# Patient Record
Sex: Male | Born: 1965 | Race: White | Hispanic: No | Marital: Single | Smoking: Current some day smoker
Health system: Southern US, Community
[De-identification: ages and names within clinical notes are randomized; demographics above are authoritative.]

## PROBLEM LIST (undated history)

## (undated) DIAGNOSIS — J942 Hemothorax: Secondary | ICD-10-CM

## (undated) HISTORY — PX: CHEST TUBE INSERTION: SHX231

---
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
## 2019-02-12 ENCOUNTER — Inpatient Hospital Stay (HOSPITAL_COMMUNITY)
Admission: EM | Admit: 2019-02-12 | Discharge: 2019-02-16 | DRG: 200 | Disposition: A | Discharge status: Home / Self Care | Payer: Managed Care, Other (non HMO) | Attending: Surgery | Admitting: Surgery

## 2019-02-12 ENCOUNTER — Emergency Department (HOSPITAL_COMMUNITY): Payer: Managed Care, Other (non HMO)

## 2019-02-12 ENCOUNTER — Encounter (HOSPITAL_COMMUNITY): Payer: Self-pay | Admitting: *Deleted

## 2019-02-12 ENCOUNTER — Other Ambulatory Visit: Payer: Self-pay

## 2019-02-12 DIAGNOSIS — Z9689 Presence of other specified functional implants: Secondary | ICD-10-CM

## 2019-02-12 DIAGNOSIS — J90 Pleural effusion, not elsewhere classified: Secondary | ICD-10-CM | POA: Diagnosis present

## 2019-02-12 DIAGNOSIS — J439 Emphysema, unspecified: Secondary | ICD-10-CM | POA: Diagnosis present

## 2019-02-12 DIAGNOSIS — E860 Dehydration: Secondary | ICD-10-CM | POA: Diagnosis present

## 2019-02-12 DIAGNOSIS — W11XXXA Fall on and from ladder, initial encounter: Secondary | ICD-10-CM | POA: Diagnosis present

## 2019-02-12 DIAGNOSIS — S2242XA Multiple fractures of ribs, left side, initial encounter for closed fracture: Secondary | ICD-10-CM | POA: Diagnosis present

## 2019-02-12 DIAGNOSIS — J939 Pneumothorax, unspecified: Secondary | ICD-10-CM

## 2019-02-12 DIAGNOSIS — E875 Hyperkalemia: Secondary | ICD-10-CM | POA: Diagnosis present

## 2019-02-12 DIAGNOSIS — Z4682 Encounter for fitting and adjustment of non-vascular catheter: Secondary | ICD-10-CM

## 2019-02-12 DIAGNOSIS — F17210 Nicotine dependence, cigarettes, uncomplicated: Secondary | ICD-10-CM | POA: Diagnosis present

## 2019-02-12 DIAGNOSIS — S272XXA Traumatic hemopneumothorax, initial encounter: Secondary | ICD-10-CM | POA: Diagnosis present

## 2019-02-12 DIAGNOSIS — N289 Disorder of kidney and ureter, unspecified: Secondary | ICD-10-CM | POA: Diagnosis present

## 2019-02-12 DIAGNOSIS — S299XXA Unspecified injury of thorax, initial encounter: Secondary | ICD-10-CM | POA: Diagnosis present

## 2019-02-12 DIAGNOSIS — Z1159 Encounter for screening for other viral diseases: Secondary | ICD-10-CM | POA: Diagnosis not present

## 2019-02-12 DIAGNOSIS — J410 Simple chronic bronchitis: Secondary | ICD-10-CM | POA: Diagnosis present

## 2019-02-12 DIAGNOSIS — J942 Hemothorax: Secondary | ICD-10-CM

## 2019-02-12 DIAGNOSIS — Z9889 Other specified postprocedural states: Secondary | ICD-10-CM

## 2019-02-12 DIAGNOSIS — S270XXA Traumatic pneumothorax, initial encounter: Secondary | ICD-10-CM

## 2019-02-12 HISTORY — DX: Hemothorax: J94.2

## 2019-02-12 LAB — BPAM FFP
Blood Product Expiration Date: 202007052359
Blood Product Expiration Date: 202007052359
ISSUE DATE / TIME: 202007031109
ISSUE DATE / TIME: 202007031109
Unit Type and Rh: 6200
Unit Type and Rh: 6200

## 2019-02-12 LAB — COMPREHENSIVE METABOLIC PANEL
ALT: 22 U/L (ref 0–44)
AST: 39 U/L (ref 15–41)
Albumin: 3.8 g/dL (ref 3.5–5.0)
Alkaline Phosphatase: 79 U/L (ref 38–126)
Anion gap: 11 (ref 5–15)
BUN: 20 mg/dL (ref 6–20)
CO2: 21 mmol/L — ABNORMAL LOW (ref 22–32)
Calcium: 8.8 mg/dL — ABNORMAL LOW (ref 8.9–10.3)
Chloride: 103 mmol/L (ref 98–111)
Creatinine, Ser: 1.06 mg/dL (ref 0.61–1.24)
GFR calc Af Amer: >60 mL/min (ref >60)
GFR calc non Af Amer: >60 mL/min (ref >60)
Glucose, Bld: 136 mg/dL — ABNORMAL HIGH (ref 70–99)
Potassium: 5.4 mmol/L — ABNORMAL HIGH (ref 3.5–5.1)
Sodium: 135 mmol/L (ref 135–145)
Total Bilirubin: 1.2 mg/dL (ref 0.3–1.2)
Total Protein: 6.9 g/dL (ref 6.5–8.1)

## 2019-02-12 LAB — CBC
HCT: 48.7 % (ref 39.0–52.0)
HCT: 46.5 % (ref 39.0–52.0)
Hemoglobin: 16.8 g/dL (ref 13.0–17.0)
Hemoglobin: 16.3 g/dL (ref 13.0–17.0)
MCH: 31.3 pg (ref 26.0–34.0)
MCH: 31.2 pg (ref 26.0–34.0)
MCHC: 34.5 g/dL (ref 30.0–36.0)
MCHC: 35.1 g/dL (ref 30.0–36.0)
MCV: 90.7 fL (ref 80.0–100.0)
MCV: 88.9 fL (ref 80.0–100.0)
Platelets: 281 10*3/uL (ref 150–400)
Platelets: 263 10*3/uL (ref 150–400)
RBC: 5.37 MIL/uL (ref 4.22–5.81)
RBC: 5.23 MIL/uL (ref 4.22–5.81)
RDW: 12.7 % (ref 11.5–15.5)
RDW: 12.5 % (ref 11.5–15.5)
WBC: 18.2 10*3/uL — ABNORMAL HIGH (ref 4.0–10.5)
WBC: 18.5 10*3/uL — ABNORMAL HIGH (ref 4.0–10.5)
nRBC: 0 % (ref 0.0–0.2)
nRBC: 0 % (ref 0.0–0.2)

## 2019-02-12 LAB — CREATININE, SERUM
Creatinine, Ser: 1.21 mg/dL (ref 0.61–1.24)
GFR calc Af Amer: >60 mL/min (ref >60)
GFR calc non Af Amer: >60 mL/min (ref >60)

## 2019-02-12 LAB — PREPARE FRESH FROZEN PLASMA
Unit division: 0
Unit division: 0

## 2019-02-12 LAB — ETHANOL: Alcohol, Ethyl (B): <10 mg/dL (ref <10)

## 2019-02-12 LAB — PROTIME-INR
INR: 1 (ref 0.8–1.2)
Prothrombin Time: 12.8 seconds (ref 11.4–15.2)

## 2019-02-12 LAB — SARS CORONAVIRUS 2 BY RT PCR (HOSPITAL ORDER, PERFORMED IN ~~LOC~~ HOSPITAL LAB): SARS Coronavirus 2: NEGATIVE

## 2019-02-12 LAB — ABO/RH: ABO/RH(D): O POS

## 2019-02-12 MED ORDER — HYDROMORPHONE HCL 1 MG/ML IJ SOLN
1.0000 mg | Freq: Once | INTRAMUSCULAR | Status: AC
  Administered 2019-02-12: 1 mg via INTRAVENOUS
  Filled 2019-02-12: qty 1

## 2019-02-12 MED ORDER — HYDROMORPHONE HCL 1 MG/ML IJ SOLN
2.0000 mg | Freq: Once | INTRAMUSCULAR | Status: AC
  Administered 2019-02-12: 2 mg via INTRAVENOUS
  Filled 2019-02-12: qty 2

## 2019-02-12 MED ORDER — ONDANSETRON 4 MG PO TBDP: 4.0000 mg | ORAL_TABLET | Freq: Four times a day (QID) | ORAL | Status: DC | PRN

## 2019-02-12 MED ORDER — ACETAMINOPHEN 325 MG PO TABS: 650.0000 mg | ORAL_TABLET | ORAL | Status: DC | PRN

## 2019-02-12 MED ORDER — METOPROLOL TARTRATE 5 MG/5ML IV SOLN: 5.0000 mg | Freq: Four times a day (QID) | INTRAVENOUS | Status: DC | PRN

## 2019-02-12 MED ORDER — ENOXAPARIN SODIUM 40 MG/0.4ML ~~LOC~~ SOLN
40.0000 mg | SUBCUTANEOUS | Status: DC
  Administered 2019-02-13 – 2019-02-16 (×4): 40 mg via SUBCUTANEOUS
  Filled 2019-02-12 (×4): qty 0.4

## 2019-02-12 MED ORDER — HYDROMORPHONE HCL 1 MG/ML IJ SOLN
0.5000 mg | INTRAMUSCULAR | Status: DC | PRN
  Administered 2019-02-12 – 2019-02-13 (×2): 1 mg via INTRAVENOUS
  Filled 2019-02-12: qty 1
  Filled 2019-02-12: qty 2

## 2019-02-12 MED ORDER — OXYCODONE HCL 5 MG PO TABS
5.0000 mg | ORAL_TABLET | ORAL | Status: DC | PRN
  Administered 2019-02-12: 5 mg via ORAL
  Administered 2019-02-13 (×3): 10 mg via ORAL
  Filled 2019-02-12: qty 2
  Filled 2019-02-12: qty 1
  Filled 2019-02-12 (×2): qty 2

## 2019-02-12 MED ORDER — BACITRACIN ZINC 500 UNIT/GM EX OINT
TOPICAL_OINTMENT | Freq: Two times a day (BID) | CUTANEOUS | Status: DC
  Administered 2019-02-12 – 2019-02-14 (×4): 31.5556 via TOPICAL
  Administered 2019-02-14 – 2019-02-15 (×2): 1 via TOPICAL
  Administered 2019-02-15 – 2019-02-16 (×2): 31.5556 via TOPICAL
  Filled 2019-02-12: qty 28.4

## 2019-02-12 MED ORDER — DOCUSATE SODIUM 100 MG PO CAPS
100.0000 mg | ORAL_CAPSULE | Freq: Two times a day (BID) | ORAL | Status: DC
  Administered 2019-02-12 – 2019-02-16 (×8): 100 mg via ORAL
  Filled 2019-02-12 (×8): qty 1

## 2019-02-12 MED ORDER — FENTANYL CITRATE (PF) 100 MCG/2ML IJ SOLN
INTRAMUSCULAR | Status: AC
  Filled 2019-02-12: qty 2

## 2019-02-12 MED ORDER — IOPAMIDOL (ISOVUE-300) INJECTION 61%
100.0000 mL | Freq: Once | INTRAVENOUS | Status: AC | PRN
  Administered 2019-02-12: 14:00:00 100 mL via INTRAVENOUS

## 2019-02-12 MED ORDER — DEXTROSE-NACL 5-0.9 % IV SOLN
INTRAVENOUS | Status: DC
  Administered 2019-02-12 – 2019-02-16 (×5): via INTRAVENOUS

## 2019-02-12 MED ORDER — ONDANSETRON HCL 4 MG/2ML IJ SOLN: 4.0000 mg | Freq: Four times a day (QID) | INTRAMUSCULAR | Status: DC | PRN

## 2019-02-12 NOTE — ED Notes (Signed)
Attempted to call wife [phone]  Mail box full unable to leave message

## 2019-02-12 NOTE — ED Notes (Signed)
ED TO INPATIENT HANDOFF REPORT  ED Nurse Name and Phone #:   S Name/Age/Gender Jake Roach 53 y.o. male Room/Bed: TRAAC/TRAAC  Code Status   Code Status: Full Code  Home/SNF/Other Home Patient oriented to: self Is this baseline? Yes   Triage Complete: Triage complete  Chief Complaint Level I  Triage Note Patient presents to ed via [facility] states he fell 10 feet yest off a ladder landing on his left side. States he thought he would be ok, went home and this am states he was having increased sob and swelling to left anterior chest. Patient is alert oriented.    Allergies No Known Allergies  Level of Care/Admitting Diagnosis ED Disposition    ED Disposition Condition Comment   Admit  [facility] [[id number]]  Level of Care: Progressive [102]  Covid Evaluation: Asymptomatic Screening Protocol (No Symptoms)  Diagnosis: Hemopneumothorax on left [[id number]][[id number]]  Admitting Physician: TRAUMA MD [[id number]]  Attending Physician: TRAUMA MD [[id number]]  Estimated length of stay: past midnight tomorrow  Certification:: I certify this patient will need inpatient services for at least 2 midnights  PT Class (Do Not Modify): Inpatient [101]  PT Acc Code (Do Not Modify): Private [1]       B Medical/Surgery History History reviewed. No pertinent past medical history. History reviewed. No pertinent surgical history.   A IV Location/Drains/Wounds Patient Lines/Drains/Airways Status   Active Line/Drains/Airways    Name:   Placement date:   Placement time:   Site:   Days:   Peripheral IV 02/12/19 Right Antecubital   02/12/19    1100    Antecubital   less than 1   Peripheral IV 02/12/19 Anterior;Distal;Left;Upper;Right Antecubital   02/12/19    1158    Antecubital   less than 1          Intake/Output Last 24 hours No intake or output data in the 24 hours ending 02/12/19 1825  Labs/Imaging Results for orders placed or performed during the hospital  encounter of 02/12/19 (from the past 48 hour(s))  Prepare fresh frozen plasma     Status: None   Collection Time: 02/12/19 11:05 AM  Result Value Ref Range   Unit Number [id number]    Blood Component Type THAWED PLASMA    Unit division 00    Status of Unit REL FROM [facility]    Unit tag comment EMERGENCY RELEASE    Transfusion Status OK TO TRANSFUSE    Unit Number [id number]    Blood Component Type THAWED PLASMA    Unit division 00    [facility]    Unit tag comment EMERGENCY RELEASE    Transfusion Status      OK TO TRANSFUSE Performed at [facility], [address] [place], [place] [address]   Type and screen Ordered by PROVIDER DEFAULT     Status: None (Preliminary result)   Collection Time: 02/12/19 11:14 AM  Result Value Ref Range   ABO/RH(D) O POS    Antibody Screen NEG    Sample Expiration 02/15/2019,2359    Unit Number [id number]    Blood Component Type RED CELLS,LR    Unit division 00    Status of Unit ALLOCATED    Transfusion Status OK TO TRANSFUSE    Crossmatch Result COMPATIBLE    Unit Number [id number]    Blood Component Type RED CELLS,LR    Unit division 00    Status of Unit ALLOCATED  Transfusion Status OK TO TRANSFUSE    Crossmatch Result COMPATIBLE   ABO/Rh     Status: None   Collection Time: 02/12/19 11:14 AM  Result Value Ref Range   ABO/RH(D)      O POS Performed at [facility], [address], [place], [place] [address]   SARS Coronavirus 2 (CEPHEID - Performed in [facility] hospital lab), Hosp Order     Status: None   Collection Time: 02/12/19 11:20 AM   Specimen: Nasopharyngeal Swab  Result Value Ref Range   SARS Coronavirus 2 NEGATIVE NEGATIVE    Comment: (NOTE) If result is NEGATIVE SARS-CoV-2 target nucleic acids are NOT DETECTED. The SARS-CoV-2 RNA is generally detectable in upper and lower  respiratory specimens during the acute phase of infection. The lowest  concentration of SARS-CoV-2 viral  copies this assay can detect is 250  copies / mL. A negative result does not preclude SARS-CoV-2 infection  and should not be used as the sole basis for treatment or other  patient management decisions.  A negative result may occur with  improper specimen collection / handling, submission of specimen other  than nasopharyngeal swab, presence of viral mutation(s) within the  areas targeted by this assay, and inadequate number of viral copies  (<250 copies / mL). A negative result must be combined with clinical  observations, patient history, and epidemiological information. If result is POSITIVE SARS-CoV-2 target nucleic acids are DETECTED. The SARS-CoV-2 RNA is generally detectable in upper and lower  respiratory specimens dur ing the acute phase of infection.  Positive  results are indicative of active infection with SARS-CoV-2.  Clinical  correlation with patient history and other diagnostic information is  necessary to determine patient infection status.  Positive results do  not rule out bacterial infection or co-infection with other viruses. If result is PRESUMPTIVE POSTIVE SARS-CoV-2 nucleic acids MAY BE PRESENT.   A presumptive positive result was obtained on the submitted specimen  and confirmed on repeat testing.  While 2019 novel coronavirus  (SARS-CoV-2) nucleic acids may be present in the submitted sample  additional confirmatory testing may be necessary for epidemiological  and / or clinical management purposes  to differentiate between  SARS-CoV-2 and other Sarbecovirus currently known to infect humans.  If clinically indicated additional testing with an alternate test  methodology [phone]) is advised. The SARS-CoV-2 RNA is generally  detectable in upper and lower respiratory sp ecimens during the acute  phase of infection. The expected result is Negative. Fact Sheet for Patients:  BoilerBrush.com.cy Fact Sheet for Healthcare  Providers: https://pope.com/ This test is not yet approved or cleared by the [place] FDA and has been authorized for detection and/or diagnosis of SARS-CoV-2 by FDA under an Emergency Use Authorization (EUA).  This EUA will remain in effect (meaning this test can be used) for the duration of the COVID-19 declaration under Section 564(b)(1) of the Act, 21 U.S.C. section 360bbb-3(b)(1), unless the authorization is terminated or revoked sooner. Performed at [facility], [address] [place], [place] [address]   Comprehensive metabolic panel     Status: Abnormal   Collection Time: 02/12/19 12:26 PM  Result Value Ref Range   Sodium 135 135 - 145 mmol/L   Potassium 5.4 (H) 3.5 - 5.1 mmol/L   Chloride 103 98 - 111 mmol/L   CO2 21 (L) 22 - 32 mmol/L   Glucose, Bld 136 (H) 70 - 99 mg/dL   BUN 20 6 - 20 mg/dL  Creatinine, Ser 1.06 0.61 - 1.24 mg/dL   Calcium 8.8 (L) 8.9 - 10.3 mg/dL   Total Protein 6.9 6.5 - 8.1 g/dL   Albumin 3.8 3.5 - 5.0 g/dL   AST 39 15 - 41 U/L   ALT 22 0 - 44 U/L   Alkaline Phosphatase 79 38 - 126 U/L   Total Bilirubin 1.2 0.3 - 1.2 mg/dL   GFR calc non Af Amer >60 >60 mL/min   GFR calc Af Amer >60 >60 mL/min   Anion gap 11 5 - 15    Comment: Performed at [facility], [address] [place], [place] [address]  CBC     Status: Abnormal   Collection Time: 02/12/19 12:26 PM  Result Value Ref Range   WBC 18.2 (H) 4.0 - 10.5 K/uL   RBC 5.37 4.22 - 5.81 MIL/uL   Hemoglobin 16.8 13.0 - 17.0 g/dL   HCT 60.448.7 54.039.0 - 98.152.0 %   MCV 90.7 80.0 - 100.0 fL   MCH 31.3 26.0 - 34.0 pg   MCHC 34.5 30.0 - 36.0 g/dL   RDW 19.112.7 47.811.5 - 29.515.5 %   Platelets 281 150 - 400 K/uL   nRBC 0.0 0.0 - 0.2 %    Comment: Performed at [facility], [address] [place], [place] [address]  Ethanol     Status: None   Collection Time: 02/12/19 12:26 PM  Result Value Ref Range   Alcohol, Ethyl (B) <10 <10 mg/dL    Comment: (NOTE) Lowest  detectable limit for serum alcohol is 10 mg/dL. For medical purposes only. Performed at [facility], [address] [place], [place] [address]   Protime-INR     Status: None   Collection Time: 02/12/19 12:26 PM  Result Value Ref Range   Prothrombin Time 12.8 11.4 - 15.2 seconds   INR 1.0 0.8 - 1.2    Comment: (NOTE) INR goal varies based on device and disease states. Performed at [facility], [address] [place], [place] [address]    Ct Head Wo Contrast  Result Date: 02/12/2019 CLINICAL DATA:  Patient status post fall from a ladder. EXAM: CT HEAD WITHOUT CONTRAST CT CERVICAL SPINE WITHOUT CONTRAST TECHNIQUE: Multidetector CT imaging of the head and cervical spine was performed following the standard protocol without intravenous contrast. Multiplanar CT image reconstructions of the cervical spine were also generated. COMPARISON:  None. FINDINGS: CT HEAD FINDINGS Brain: Ventricles and sulci are appropriate for patient's age. No evidence for acute cortically based infarct, intracranial hemorrhage, mass lesion or mass-effect. Vascular: Unremarkable Skull: Intact Sinuses/Orbits: Right maxillary sinus mucosal thickening. Mastoid air cells are unremarkable. Orbits are unremarkable. Other: Extensive gas within the soft tissues about the skull base. CT CERVICAL SPINE FINDINGS Alignment: Normal anatomic alignment. Skull base and vertebrae: No acute fracture. No primary bone lesion or focal pathologic process. Soft tissues and spinal canal: No prevertebral fluid or swelling. No visible canal hematoma. Disc levels: Degenerative disc disease most pronounced C5-6 and C6-7. Left-sided facet degenerative changes C2-3. No acute fracture. Upper chest: There is an incompletely visualized left apical pneumothorax. There is a layering right hemothorax. Pneumomediastinum. Extensive gas throughout the cervical soft tissues. Other: None IMPRESSION: 1. There is a moderate left apical pneumothorax. Left  chest tube is visualizedon scout. 2. Layering right hemothorax, incompletely visualized. 3. Pneumomediastinum. 4. Extensive gas throughout the visualized cervical soft tissues. 5. No acute intracranial process. 6. No acute cervical spine fracture. 7. Degenerative disc  disease. Electronically Signed   By: Annia Belt M.D.   On: 02/12/2019 13:09   Ct Chest W Contrast  Result Date: 02/12/2019 CLINICAL DATA:  Blunt abdominal trauma. EXAM: CT CHEST, ABDOMEN, AND PELVIS WITH CONTRAST TECHNIQUE: Multidetector CT imaging of the chest, abdomen and pelvis was performed following the standard protocol during bolus administration of intravenous contrast. CONTRAST:  ISOVUE-300 IOPAMIDOL (ISOVUE-300) INJECTION 61% COMPARISON:  None. FINDINGS: CT CHEST FINDINGS Cardiovascular: No significant vascular findings. Normal heart size. No pericardial effusion. Mediastinum/Nodes: Extensive pneumomediastinum is noted which extends into the cervical soft tissues, bilateral supraclavicular area and along the left chest wall. Thyroid gland is unremarkable. No definite adenopathy is noted. Lungs/Pleura: Large right pleural effusion is noted. Emphysematous disease is noted. Pigtail catheter is noted anteriorly in the left upper lobe with minimal left apical pneumothorax. Mild left posterior basilar atelectasis is noted. Musculoskeletal: As noted above, extensive subcutaneous emphysema is seen involving the left lateral chest wall mildly displaced fractures are seen involving the left sixth, seventh, eighth, ninth and tenth ribs posteriorly. CT ABDOMEN PELVIS FINDINGS Hepatobiliary: No focal liver abnormality is seen. No gallstones, gallbladder wall thickening, or biliary dilatation. Pancreas: Unremarkable. No pancreatic ductal dilatation or surrounding inflammatory changes. Spleen: Normal in size without focal abnormality. Adrenals/Urinary Tract: Adrenal glands are unremarkable. Kidneys are normal, without renal calculi, focal lesion,  or hydronephrosis. Bladder is unremarkable. Stomach/Bowel: Stomach is within normal limits. Appendix appears normal. No evidence of bowel wall thickening, distention, or inflammatory changes. Vascular/Lymphatic: No significant vascular findings are present. No enlarged abdominal or pelvic lymph nodes. Reproductive: Prostate is unremarkable. Other: Extensive subcutaneous emphysema is seen involving both lateral abdominal walls extending into the lower anterior abdominal and pelvic wall and scrotum. Musculoskeletal: No acute or significant osseous findings. IMPRESSION: Extensive pneumomediastinum is noted concerning for possible esophageal, tracheal or bronchial injury. This is seen to extend into the cervical soft tissues, as well as the bilateral supraclavicular areas and along the left lateral chest wall. It is also noted to extend into both lateral abdominal walls and into the lower abdominal and pelvic wall and scrotum. Pigtail left-sided chest tube is noted with minimal left apical pneumothorax. Large right pleural effusion is noted. Mild left posterior basilar subsegmental atelectasis is noted. Mildly displaced left sixth, seventh, eighth, ninth and tenth rib fractures are noted. Emphysema (ICD10-J43.9). Electronically Signed   By: Lupita Raider M.D.   On: 02/12/2019 14:46   Ct Cervical Spine Wo Contrast  Result Date: 02/12/2019 CLINICAL DATA:  Patient status post fall from a ladder. EXAM: CT HEAD WITHOUT CONTRAST CT CERVICAL SPINE WITHOUT CONTRAST TECHNIQUE: Multidetector CT imaging of the head and cervical spine was performed following the standard protocol without intravenous contrast. Multiplanar CT image reconstructions of the cervical spine were also generated. COMPARISON:  None. FINDINGS: CT HEAD FINDINGS Brain: Ventricles and sulci are appropriate for patient's age. No evidence for acute cortically based infarct, intracranial hemorrhage, mass lesion or mass-effect. Vascular: Unremarkable Skull:  Intact Sinuses/Orbits: Right maxillary sinus mucosal thickening. Mastoid air cells are unremarkable. Orbits are unremarkable. Other: Extensive gas within the soft tissues about the skull base. CT CERVICAL SPINE FINDINGS Alignment: Normal anatomic alignment. Skull base and vertebrae: No acute fracture. No primary bone lesion or focal pathologic process. Soft tissues and spinal canal: No prevertebral fluid or swelling. No visible canal hematoma. Disc levels: Degenerative disc disease most pronounced C5-6 and C6-7. Left-sided facet degenerative changes C2-3. No acute fracture. Upper chest: There is an incompletely visualized left apical  pneumothorax. There is a layering right hemothorax. Pneumomediastinum. Extensive gas throughout the cervical soft tissues. Other: None IMPRESSION: 1. There is a moderate left apical pneumothorax. Left chest tube is visualizedon scout. 2. Layering right hemothorax, incompletely visualized. 3. Pneumomediastinum. 4. Extensive gas throughout the visualized cervical soft tissues. 5. No acute intracranial process. 6. No acute cervical spine fracture. 7. Degenerative disc disease. Electronically Signed   By: Lovey Newcomer M.D.   On: 02/12/2019 13:09   Ct Abdomen Pelvis W Contrast  Result Date: 02/12/2019 CLINICAL DATA:  Blunt abdominal trauma. EXAM: CT CHEST, ABDOMEN, AND PELVIS WITH CONTRAST TECHNIQUE: Multidetector CT imaging of the chest, abdomen and pelvis was performed following the standard protocol during bolus administration of intravenous contrast. CONTRAST:  163mL ISOVUE-300 IOPAMIDOL (ISOVUE-300) INJECTION 61% COMPARISON:  None. FINDINGS: CT CHEST FINDINGS Cardiovascular: No significant vascular findings. Normal heart size. No pericardial effusion. Mediastinum/Nodes: Extensive pneumomediastinum is noted which extends into the cervical soft tissues, bilateral supraclavicular area and along the left chest wall. Thyroid gland is unremarkable. No definite adenopathy is noted.  Lungs/Pleura: Large right pleural effusion is noted. Emphysematous disease is noted. Pigtail catheter is noted anteriorly in the left upper lobe with minimal left apical pneumothorax. Mild left posterior basilar atelectasis is noted. Musculoskeletal: As noted above, extensive subcutaneous emphysema is seen involving the left lateral chest wall mildly displaced fractures are seen involving the left sixth, seventh, eighth, ninth and tenth ribs posteriorly. CT ABDOMEN PELVIS FINDINGS Hepatobiliary: No focal liver abnormality is seen. No gallstones, gallbladder wall thickening, or biliary dilatation. Pancreas: Unremarkable. No pancreatic ductal dilatation or surrounding inflammatory changes. Spleen: Normal in size without focal abnormality. Adrenals/Urinary Tract: Adrenal glands are unremarkable. Kidneys are normal, without renal calculi, focal lesion, or hydronephrosis. Bladder is unremarkable. Stomach/Bowel: Stomach is within normal limits. Appendix appears normal. No evidence of bowel wall thickening, distention, or inflammatory changes. Vascular/Lymphatic: No significant vascular findings are present. No enlarged abdominal or pelvic lymph nodes. Reproductive: Prostate is unremarkable. Other: Extensive subcutaneous emphysema is seen involving both lateral abdominal walls extending into the lower anterior abdominal and pelvic wall and scrotum. Musculoskeletal: No acute or significant osseous findings. IMPRESSION: Extensive pneumomediastinum is noted concerning for possible esophageal, tracheal or bronchial injury. This is seen to extend into the cervical soft tissues, as well as the bilateral supraclavicular areas and along the left lateral chest wall. It is also noted to extend into both lateral abdominal walls and into the lower abdominal and pelvic wall and scrotum. Pigtail left-sided chest tube is noted with minimal left apical pneumothorax. Large right pleural effusion is noted. Mild left posterior basilar  subsegmental atelectasis is noted. Mildly displaced left sixth, seventh, eighth, ninth and tenth rib fractures are noted. Emphysema (ICD10-J43.9). Electronically Signed   By: Marijo Conception M.D.   On: 02/12/2019 14:46   Dg Chest Portable 1 View  Result Date: 02/12/2019 CLINICAL DATA:  Status post left chest tube insertion. EXAM: PORTABLE CHEST 1 VIEW COMPARISON:  February 12, 2019 11:02 a.m. FINDINGS: The heart size and mediastinal contours are stable. There is interval insertion of left chest tube with significant interval decrease of previously noted left pneumothorax. Evaluation of the left lung is limited due to extensive subcutaneous emphysema over left chest and bilateral neck. Right pleural effusion is unchanged. The visualized skeletal structures are stable. IMPRESSION: Interval insertion of left chest tube with significant interval decrease of previously noted left pneumothorax. Evaluation of the left lung is limited due to extensive subcutaneous emphysema of left chest and  bilateral neck. Electronically Signed   By: Sherian Rein M.D.   On: 02/12/2019 12:11   Dg Chest Portable 1 View  Result Date: 02/12/2019 CLINICAL DATA:  Patient status post fall from ladder. EXAM: PORTABLE CHEST 1 VIEW COMPARISON:  None. FINDINGS: Monitoring leads overlie the patient. Normal cardiac contours. Moderate left pneumothorax. Moderate right pleural effusion. Trace left pleural effusion. Consolidation throughout the lungs bilaterally. Pneumomediastinum. Extensive subcutaneous emphysema overlying the neck and left chest wall soft tissues. Thoracic spine degenerative changes. Displaced left posterior and lateral lower rib fractures. IMPRESSION: 1. Moderate to large left pneumothorax. Subsequent chest radiograph demonstrates a left chest tube in place. 2. Findings suggestive of pneumomediastinum. 3. Moderate right pleural effusion.  Trace left pleural effusion. 4. Consolidative opacities throughout the lungs bilaterally which  may represent contusion or edema. 5. Extensive subcutaneous emphysema overlying the lower neck and left chest wall. 6. Multiple left-sided rib fractures. Electronically Signed   By: Annia Belt M.D.   On: 02/12/2019 12:03   Dg Esophagus W Single Cm (sol Or Thin Ba)  Result Date: 02/12/2019 CLINICAL DATA:  Chest trauma EXAM: ESOPHOGRAM/BARIUM SWALLOW TECHNIQUE: Single contrast examination was performed using water-soluble contrast; Omnipaque 300. FLUOROSCOPY TIME:  Fluoroscopy Time:  30 seconds Radiation Exposure Index (if provided by the fluoroscopic device): Not applicable. Number of Acquired Spot Images: 0 COMPARISON:  CT of earlier today FINDINGS: Focused, single-contrast exam performed with patient in mild LPO and RPO positioning. Preprocedure scout film demonstrates a left-sided chest tube in place with pneumomediastinum. Full column evaluation of the esophagus demonstrates no contrast extravasation to suggest perforation. No obstruction to contrast passage. No pleural contrast at the end of the exam. IMPRESSION: No evidence of esophageal perforation. Electronically Signed   By: Jeronimo Greaves M.D.   On: 02/12/2019 17:08    Pending Labs Unresulted Labs (From admission, onward)    Start     Ordered   02/19/19 0500  Creatinine, serum  (enoxaparin (LOVENOX)    CrCl >/= 30 ml/min)  Weekly,   R    Comments: while on enoxaparin therapy    02/12/19 1747   02/13/19 0500  CBC  Tomorrow morning,   R     02/12/19 1747   02/13/19 0500  Basic metabolic panel  Tomorrow morning,   R     02/12/19 1747   02/12/19 1743  HIV antibody (Routine Testing)  Once,   STAT     02/12/19 1747   02/12/19 1743  CBC  (enoxaparin (LOVENOX)    CrCl >/= 30 ml/min)  Once,   STAT    Comments: Baseline for enoxaparin therapy IF NOT ALREADY DRAWN.  Notify MD if PLT < 100 K.    02/12/19 1747   02/12/19 1743  Creatinine, serum  (enoxaparin (LOVENOX)    CrCl >/= 30 ml/min)  Once,   STAT    Comments: Baseline for enoxaparin therapy  IF NOT ALREADY DRAWN.    02/12/19 1747   02/12/19 1216  CDS serology  (Trauma Level 1)  Once,   STAT     02/12/19 1215          Vitals/Pain Today's Vitals   02/12/19 1645 02/12/19 1700 02/12/19 1710 02/12/19 1821  BP: (!) 142/129 (!) 131/92    Pulse: 94 91    Resp: 15 14    SpO2: 99% 98%    Weight:      Height:      PainSc:   5  7     Isolation  Precautions No active isolations  Medications Medications  fentaNYL (SUBLIMAZE) 100 MCG/2ML injection (has no administration in time range)  enoxaparin (LOVENOX) injection 40 mg (has no administration in time range)  oxyCODONE (Oxy IR/ROXICODONE) immediate release tablet 5-10 mg (5 mg Oral Given 02/12/19 1822)  acetaminophen (TYLENOL) tablet 650 mg (has no administration in time range)  HYDROmorphone (DILAUDID) injection 0.5-2 mg (has no administration in time range)  docusate sodium (COLACE) capsule 100 mg (has no administration in time range)  ondansetron (ZOFRAN-ODT) disintegrating tablet 4 mg (has no administration in time range)    Or  ondansetron (ZOFRAN) injection 4 mg (has no administration in time range)  metoprolol tartrate (LOPRESSOR) injection 5 mg (has no administration in time range)  dextrose 5 %-0.9 % sodium chloride infusion (has no administration in time range)  bacitracin ointment (has no administration in time range)  iopamidol (ISOVUE-300) 61 % injection 100 mL (100 mLs Intravenous Contrast Given 02/12/19 1422)  HYDROmorphone (DILAUDID) injection 1 mg (1 mg Intravenous Given 02/12/19 1426)  HYDROmorphone (DILAUDID) injection 2 mg (2 mg Intravenous Given 02/12/19 1616)    Mobility walks Low fall risk   Focused Assessments Cardiac Assessment Handoff:  Cardiac Rhythm: Normal sinus rhythm No results found for: CKTOTAL, CKMB, CKMBINDEX, TROPONINI No results found for: DDIMER Does the Patient currently have chest pain? No      R Recommendations: See Admitting Provider Note  Report given to:   Additional  Notes:

## 2019-02-12 NOTE — ED Notes (Signed)
Communication with Dr. Barry Dienes. New order for pain medication taken verbal over phone for 2 mg dilaudid once. Pt will now go to imaging. Per Dr. Barry Dienes , Orders will be entered for admission once results are available.

## 2019-02-12 NOTE — ED Notes (Signed)
Called patients daughter Magda Paganini update given. Aware of room number patient will be going to.

## 2019-02-12 NOTE — ED Triage Notes (Signed)
Patient presents to ed via [facility] states he fell 10 feet yest off a ladder landing on his left side. States he thought he would be ok, went home and this am states he was having increased sob and swelling to left anterior chest. Patient is alert oriented.

## 2019-02-12 NOTE — ED Notes (Signed)
Transported to CT 

## 2019-02-12 NOTE — H&P (Addendum)
History   Jake Roach is an 53 y.o. male.   Chief Complaint:  Chief Complaint  Patient presents with   Trauma    Pt came to ED as level 1 trauma.  Pt fell 10 feet from ladder yesterday.  He complains of shortness of breath.  He hit the ground.  He also got an abrasion on his back.  He denies loss of consciousness.  He denies hitting his abdomen.  He denies n/v.  He has not had any more recent falls.  He is a Administrator.  He denied any alcohol or drug use associated with the fall.     History reviewed. No pertinent past medical history.  History reviewed. No pertinent surgical history.  No family history on file. Social History:  reports that he has been smoking. He has never used smokeless tobacco. He reports current alcohol use. He reports that he does not use drugs.  Allergies  No Known Allergies  Home Medications   No outpatient medications have been marked as taking for the 02/12/19 encounter [facility] Encounter).     Trauma Course   Results for orders placed or performed during the hospital encounter of 02/12/19 (from the past 48 hour(s))  Prepare fresh frozen plasma     Status: None   Collection Time: 02/12/19 11:05 AM  Result Value Ref Range   Unit Number [id number]    Blood Component Type THAWED PLASMA    Unit division 00    Status of Unit REL FROM [facility]    Unit tag comment EMERGENCY RELEASE    Transfusion Status OK TO TRANSFUSE    Unit Number [id number]    Blood Component Type THAWED PLASMA    Unit division 00    Status of Unit REL FROM [facility]    Unit tag comment EMERGENCY RELEASE    Transfusion Status      OK TO TRANSFUSE Performed at [facility] [address], [place], [place] [address]   Type and screen Ordered by PROVIDER DEFAULT     Status: None (Preliminary result)   Collection Time: 02/12/19 11:14 AM  Result Value Ref Range   ABO/RH(D) O POS    Antibody Screen NEG    Sample Expiration 02/15/2019,2359    Unit Number  [id number]    Blood Component Type RED CELLS,LR    Unit division 00    Status of Unit ALLOCATED    Transfusion Status OK TO TRANSFUSE    Crossmatch Result COMPATIBLE    Unit Number [id number]    Blood Component Type RED CELLS,LR    Unit division 00    Status of Unit ALLOCATED    Transfusion Status OK TO TRANSFUSE    Crossmatch Result COMPATIBLE   ABO/Rh     Status: None   Collection Time: 02/12/19 11:14 AM  Result Value Ref Range   ABO/RH(D)      O POS Performed at [facility], [place] [address], [place], [place] [address]   SARS Coronavirus 2 (CEPHEID - Performed in [place] hospital lab), Hosp Order     Status: None   Collection Time: 02/12/19 11:20 AM   Specimen: Nasopharyngeal Swab  Result Value Ref Range   SARS Coronavirus 2 NEGATIVE NEGATIVE    Comment: (NOTE) If result is NEGATIVE SARS-CoV-2 target nucleic acids are NOT DETECTED. The SARS-CoV-2 RNA is generally detectable in upper and lower  respiratory specimens during the acute phase of infection. The lowest  concentration of SARS-CoV-2 viral copies  this assay can detect is 250  copies / mL. A negative result does not preclude SARS-CoV-2 infection  and should not be used as the sole basis for treatment or other  patient management decisions.  A negative result may occur with  improper specimen collection / handling, submission of specimen other  than nasopharyngeal swab, presence of viral mutation(s) within the  areas targeted by this assay, and inadequate number of viral copies  (<250 copies / mL). A negative result must be combined with clinical  observations, patient history, and epidemiological information. If result is POSITIVE SARS-CoV-2 target nucleic acids are DETECTED. The SARS-CoV-2 RNA is generally detectable in upper and lower  respiratory specimens dur ing the acute phase of infection.  Positive  results are indicative of active infection with SARS-CoV-2.  Clinical  correlation with  patient history and other diagnostic information is  necessary to determine patient infection status.  Positive results do  not rule out bacterial infection or co-infection with other viruses. If result is PRESUMPTIVE POSTIVE SARS-CoV-2 nucleic acids MAY BE PRESENT.   A presumptive positive result was obtained on the submitted specimen  and confirmed on repeat testing.  While 2019 novel coronavirus  (SARS-CoV-2) nucleic acids may be present in the submitted sample  additional confirmatory testing may be necessary for epidemiological  and / or clinical management purposes  to differentiate between  SARS-CoV-2 and other Sarbecovirus currently known to infect humans.  If clinically indicated additional testing with an alternate test  methodology [phone]) is advised. The SARS-CoV-2 RNA is generally  detectable in upper and lower respiratory sp ecimens during the acute  phase of infection. The expected result is Negative. Fact Sheet for Patients:  BoilerBrush.com.cy Fact Sheet for Healthcare Providers: https://pope.com/ This test is not yet approved or cleared by the [place] FDA and has been authorized for detection and/or diagnosis of SARS-CoV-2 by FDA under an Emergency Use Authorization (EUA).  This EUA will remain in effect (meaning this test can be used) for the duration of the COVID-19 declaration under Section 564(b)(1) of the Act, 21 U.S.C. section 360bbb-3(b)(1), unless the authorization is terminated or revoked sooner. Performed at [facility], [address], [place], [place] [address]   Comprehensive metabolic panel     Status: Abnormal   Collection Time: 02/12/19 12:26 PM  Result Value Ref Range   Sodium 135 135 - 145 mmol/L   Potassium 5.4 (H) 3.5 - 5.1 mmol/L   Chloride 103 98 - 111 mmol/L   CO2 21 (L) 22 - 32 mmol/L   Glucose, Bld 136 (H) 70 - 99 mg/dL   BUN 20 6 - 20 mg/dL   Creatinine, Ser 8.11 0.61 -  1.24 mg/dL   Calcium 8.8 (L) 8.9 - 10.3 mg/dL   Total Protein 6.9 6.5 - 8.1 g/dL   Albumin 3.8 3.5 - 5.0 g/dL   AST 39 15 - 41 U/L   ALT 22 0 - 44 U/L   Alkaline Phosphatase 79 38 - 126 U/L   Total Bilirubin 1.2 0.3 - 1.2 mg/dL   GFR calc non Af Amer >60 >60 mL/min   GFR calc Af Amer >60 >60 mL/min   Anion gap 11 5 - 15    Comment: Performed at [facility], [address], [place], [place] [address]  CBC     Status: Abnormal   Collection Time: 02/12/19 12:26 PM  Result Value Ref Range   WBC 18.2 (H) 4.0 - 10.5 K/uL  RBC 5.37 4.22 - 5.81 MIL/uL   Hemoglobin 16.8 13.0 - 17.0 g/dL   HCT 40.9 81.1 - 91.4 %   MCV 90.7 80.0 - 100.0 fL   MCH 31.3 26.0 - 34.0 pg   MCHC 34.5 30.0 - 36.0 g/dL   RDW 78.2 95.6 - 21.3 %   Platelets 281 150 - 400 K/uL   nRBC 0.0 0.0 - 0.2 %    Comment: Performed at [facility], [address], [place], [place] [address]  Ethanol     Status: None   Collection Time: 02/12/19 12:26 PM  Result Value Ref Range   Alcohol, Ethyl (B) <10 <10 mg/dL    Comment: (NOTE) Lowest detectable limit for serum alcohol is 10 mg/dL. For medical purposes only. Performed at [facility], [address] [place], [place] [address]   Protime-INR     Status: None   Collection Time: 02/12/19 12:26 PM  Result Value Ref Range   Prothrombin Time 12.8 11.4 - 15.2 seconds   INR 1.0 0.8 - 1.2    Comment: (NOTE) INR goal varies based on device and disease states. Performed at [facility], [address] [place], [place] [address]   CBC     Status: Abnormal   Collection Time: 02/12/19  5:59 PM  Result Value Ref Range   WBC 18.5 (H) 4.0 - 10.5 K/uL   RBC 5.23 4.22 - 5.81 MIL/uL   Hemoglobin 16.3 13.0 - 17.0 g/dL   HCT 41.3 24.4 - 01.0 %   MCV 88.9 80.0 - 100.0 fL   MCH 31.2 26.0 - 34.0 pg   MCHC 35.1 30.0 - 36.0 g/dL   RDW 27.2 53.6 - 64.4 %   Platelets 263 150 - 400 K/uL   nRBC 0.0 0.0 - 0.2 %    Comment: Performed at [facility],  [address] [place], [place] [address]   Ct Head Wo Contrast  Result Date: 02/12/2019 CLINICAL DATA:  Patient status post fall from a ladder. EXAM: CT HEAD WITHOUT CONTRAST CT CERVICAL SPINE WITHOUT CONTRAST TECHNIQUE: Multidetector CT imaging of the head and cervical spine was performed following the standard protocol without intravenous contrast. Multiplanar CT image reconstructions of the cervical spine were also generated. COMPARISON:  None. FINDINGS: CT HEAD FINDINGS Brain: Ventricles and sulci are appropriate for patient's age. No evidence for acute cortically based infarct, intracranial hemorrhage, mass lesion or mass-effect. Vascular: Unremarkable Skull: Intact Sinuses/Orbits: Right maxillary sinus mucosal thickening. Mastoid air cells are unremarkable. Orbits are unremarkable. Other: Extensive gas within the soft tissues about the skull base. CT CERVICAL SPINE FINDINGS Alignment: Normal anatomic alignment. Skull base and vertebrae: No acute fracture. No primary bone lesion or focal pathologic process. Soft tissues and spinal canal: No prevertebral fluid or swelling. No visible canal hematoma. Disc levels: Degenerative disc disease most pronounced C5-6 and C6-7. Left-sided facet degenerative changes C2-3. No acute fracture. Upper chest: There is an incompletely visualized left apical pneumothorax. There is a layering right hemothorax. Pneumomediastinum. Extensive gas throughout the cervical soft tissues. Other: None IMPRESSION: 1. There is a moderate left apical pneumothorax. Left chest tube is visualizedon scout. 2. Layering right hemothorax, incompletely visualized. 3. Pneumomediastinum. 4. Extensive gas throughout the visualized cervical soft tissues. 5. No acute intracranial process. 6. No acute cervical spine fracture. 7. Degenerative disc disease. Electronically Signed   By: Annia Belt M.D.   On: 02/12/2019 13:09   Ct Chest W Contrast  Result Date:  02/12/2019 CLINICAL DATA:  Blunt abdominal  trauma. EXAM: CT CHEST, ABDOMEN, AND PELVIS WITH CONTRAST TECHNIQUE: Multidetector CT imaging of the chest, abdomen and pelvis was performed following the standard protocol during bolus administration of intravenous contrast. CONTRAST:  ISOVUE-300 IOPAMIDOL (ISOVUE-300) INJECTION 61% COMPARISON:  None. FINDINGS: CT CHEST FINDINGS Cardiovascular: No significant vascular findings. Normal heart size. No pericardial effusion. Mediastinum/Nodes: Extensive pneumomediastinum is noted which extends into the cervical soft tissues, bilateral supraclavicular area and along the left chest wall. Thyroid gland is unremarkable. No definite adenopathy is noted. Lungs/Pleura: Large right pleural effusion is noted. Emphysematous disease is noted. Pigtail catheter is noted anteriorly in the left upper lobe with minimal left apical pneumothorax. Mild left posterior basilar atelectasis is noted. Musculoskeletal: As noted above, extensive subcutaneous emphysema is seen involving the left lateral chest wall mildly displaced fractures are seen involving the left sixth, seventh, eighth, ninth and tenth ribs posteriorly. CT ABDOMEN PELVIS FINDINGS Hepatobiliary: No focal liver abnormality is seen. No gallstones, gallbladder wall thickening, or biliary dilatation. Pancreas: Unremarkable. No pancreatic ductal dilatation or surrounding inflammatory changes. Spleen: Normal in size without focal abnormality. Adrenals/Urinary Tract: Adrenal glands are unremarkable. Kidneys are normal, without renal calculi, focal lesion, or hydronephrosis. Bladder is unremarkable. Stomach/Bowel: Stomach is within normal limits. Appendix appears normal. No evidence of bowel wall thickening, distention, or inflammatory changes. Vascular/Lymphatic: No significant vascular findings are present. No enlarged abdominal or pelvic lymph nodes. Reproductive: Prostate is unremarkable. Other: Extensive subcutaneous emphysema is seen involving both lateral abdominal  walls extending into the lower anterior abdominal and pelvic wall and scrotum. Musculoskeletal: No acute or significant osseous findings. IMPRESSION: Extensive pneumomediastinum is noted concerning for possible esophageal, tracheal or bronchial injury. This is seen to extend into the cervical soft tissues, as well as the bilateral supraclavicular areas and along the left lateral chest wall. It is also noted to extend into both lateral abdominal walls and into the lower abdominal and pelvic wall and scrotum. Pigtail left-sided chest tube is noted with minimal left apical pneumothorax. Large right pleural effusion is noted. Mild left posterior basilar subsegmental atelectasis is noted. Mildly displaced left sixth, seventh, eighth, ninth and tenth rib fractures are noted. Emphysema (ICD10-J43.9). Electronically Signed   By: Lupita Raider M.D.   On: 02/12/2019 14:46   Ct Cervical Spine Wo Contrast  Result Date: 02/12/2019 CLINICAL DATA:  Patient status post fall from a ladder. EXAM: CT HEAD WITHOUT CONTRAST CT CERVICAL SPINE WITHOUT CONTRAST TECHNIQUE: Multidetector CT imaging of the head and cervical spine was performed following the standard protocol without intravenous contrast. Multiplanar CT image reconstructions of the cervical spine were also generated. COMPARISON:  None. FINDINGS: CT HEAD FINDINGS Brain: Ventricles and sulci are appropriate for patient's age. No evidence for acute cortically based infarct, intracranial hemorrhage, mass lesion or mass-effect. Vascular: Unremarkable Skull: Intact Sinuses/Orbits: Right maxillary sinus mucosal thickening. Mastoid air cells are unremarkable. Orbits are unremarkable. Other: Extensive gas within the soft tissues about the skull base. CT CERVICAL SPINE FINDINGS Alignment: Normal anatomic alignment. Skull base and vertebrae: No acute fracture. No primary bone lesion or focal pathologic process. Soft tissues and spinal canal: No prevertebral fluid or swelling. No  visible canal hematoma. Disc levels: Degenerative disc disease most pronounced C5-6 and C6-7. Left-sided facet degenerative changes C2-3. No acute fracture. Upper chest: There is an incompletely visualized left apical pneumothorax. There is a layering right hemothorax. Pneumomediastinum. Extensive gas throughout the cervical soft tissues. Other: None IMPRESSION: 1. There is a moderate  left apical pneumothorax. Left chest tube is visualizedon scout. 2. Layering right hemothorax, incompletely visualized. 3. Pneumomediastinum. 4. Extensive gas throughout the visualized cervical soft tissues. 5. No acute intracranial process. 6. No acute cervical spine fracture. 7. Degenerative disc disease. Electronically Signed   By: Annia Beltrew  Davis M.D.   On: 02/12/2019 13:09   Ct Abdomen Pelvis W Contrast  Result Date: 02/12/2019 CLINICAL DATA:  Blunt abdominal trauma. EXAM: CT CHEST, ABDOMEN, AND PELVIS WITH CONTRAST TECHNIQUE: Multidetector CT imaging of the chest, abdomen and pelvis was performed following the standard protocol during bolus administration of intravenous contrast. CONTRAST:  100mL ISOVUE-300 IOPAMIDOL (ISOVUE-300) INJECTION 61% COMPARISON:  None. FINDINGS: CT CHEST FINDINGS Cardiovascular: No significant vascular findings. Normal heart size. No pericardial effusion. Mediastinum/Nodes: Extensive pneumomediastinum is noted which extends into the cervical soft tissues, bilateral supraclavicular area and along the left chest wall. Thyroid gland is unremarkable. No definite adenopathy is noted. Lungs/Pleura: Large right pleural effusion is noted. Emphysematous disease is noted. Pigtail catheter is noted anteriorly in the left upper lobe with minimal left apical pneumothorax. Mild left posterior basilar atelectasis is noted. Musculoskeletal: As noted above, extensive subcutaneous emphysema is seen involving the left lateral chest wall mildly displaced fractures are seen involving the left sixth, seventh, eighth, ninth  and tenth ribs posteriorly. CT ABDOMEN PELVIS FINDINGS Hepatobiliary: No focal liver abnormality is seen. No gallstones, gallbladder wall thickening, or biliary dilatation. Pancreas: Unremarkable. No pancreatic ductal dilatation or surrounding inflammatory changes. Spleen: Normal in size without focal abnormality. Adrenals/Urinary Tract: Adrenal glands are unremarkable. Kidneys are normal, without renal calculi, focal lesion, or hydronephrosis. Bladder is unremarkable. Stomach/Bowel: Stomach is within normal limits. Appendix appears normal. No evidence of bowel wall thickening, distention, or inflammatory changes. Vascular/Lymphatic: No significant vascular findings are present. No enlarged abdominal or pelvic lymph nodes. Reproductive: Prostate is unremarkable. Other: Extensive subcutaneous emphysema is seen involving both lateral abdominal walls extending into the lower anterior abdominal and pelvic wall and scrotum. Musculoskeletal: No acute or significant osseous findings. IMPRESSION: Extensive pneumomediastinum is noted concerning for possible esophageal, tracheal or bronchial injury. This is seen to extend into the cervical soft tissues, as well as the bilateral supraclavicular areas and along the left lateral chest wall. It is also noted to extend into both lateral abdominal walls and into the lower abdominal and pelvic wall and scrotum. Pigtail left-sided chest tube is noted with minimal left apical pneumothorax. Large right pleural effusion is noted. Mild left posterior basilar subsegmental atelectasis is noted. Mildly displaced left sixth, seventh, eighth, ninth and tenth rib fractures are noted. Emphysema (ICD10-J43.9). Electronically Signed   By: Lupita RaiderJames  Green Jr M.D.   On: 02/12/2019 14:46   Dg Chest Portable 1 View  Result Date: 02/12/2019 CLINICAL DATA:  Status post left chest tube insertion. EXAM: PORTABLE CHEST 1 VIEW COMPARISON:  February 12, 2019 11:02 a.m. FINDINGS: The heart size and mediastinal  contours are stable. There is interval insertion of left chest tube with significant interval decrease of previously noted left pneumothorax. Evaluation of the left lung is limited due to extensive subcutaneous emphysema over left chest and bilateral neck. Right pleural effusion is unchanged. The visualized skeletal structures are stable. IMPRESSION: Interval insertion of left chest tube with significant interval decrease of previously noted left pneumothorax. Evaluation of the left lung is limited due to extensive subcutaneous emphysema of left chest and bilateral neck. Electronically Signed   By: Sherian ReinWei-Chen  Lin M.D.   On: 02/12/2019 12:11   Dg Chest Portable 1 View  Result Date: 02/12/2019 CLINICAL DATA:  Patient status post fall from ladder. EXAM: PORTABLE CHEST 1 VIEW COMPARISON:  None. FINDINGS: Monitoring leads overlie the patient. Normal cardiac contours. Moderate left pneumothorax. Moderate right pleural effusion. Trace left pleural effusion. Consolidation throughout the lungs bilaterally. Pneumomediastinum. Extensive subcutaneous emphysema overlying the neck and left chest wall soft tissues. Thoracic spine degenerative changes. Displaced left posterior and lateral lower rib fractures. IMPRESSION: 1. Moderate to large left pneumothorax. Subsequent chest radiograph demonstrates a left chest tube in place. 2. Findings suggestive of pneumomediastinum. 3. Moderate right pleural effusion.  Trace left pleural effusion. 4. Consolidative opacities throughout the lungs bilaterally which may represent contusion or edema. 5. Extensive subcutaneous emphysema overlying the lower neck and left chest wall. 6. Multiple left-sided rib fractures. Electronically Signed   By: Annia Beltrew  Davis M.D.   On: 02/12/2019 12:03   Dg Esophagus W Single Cm (sol Or Thin Ba)  Result Date: 02/12/2019 CLINICAL DATA:  Chest trauma EXAM: ESOPHOGRAM/BARIUM SWALLOW TECHNIQUE: Single contrast examination was performed using water-soluble  contrast; Omnipaque 300. FLUOROSCOPY TIME:  Fluoroscopy Time:  30 seconds Radiation Exposure Index (if provided by the fluoroscopic device): Not applicable. Number of Acquired Spot Images: 0 COMPARISON:  CT of earlier today FINDINGS: Focused, single-contrast exam performed with patient in mild LPO and RPO positioning. Preprocedure scout film demonstrates a left-sided chest tube in place with pneumomediastinum. Full column evaluation of the esophagus demonstrates no contrast extravasation to suggest perforation. No obstruction to contrast passage. No pleural contrast at the end of the exam. IMPRESSION: No evidence of esophageal perforation. Electronically Signed   By: Jeronimo GreavesKyle  Talbot M.D.   On: 02/12/2019 17:08    Review of Systems  Constitutional: Negative.   HENT: Negative.   Eyes: Negative.   Respiratory: Positive for shortness of breath.   Cardiovascular: Positive for chest pain.  Skin: Negative.   Neurological: Negative.   Endo/Heme/Allergies: Negative.   Psychiatric/Behavioral: Negative.     Blood pressure (!) 141/97, pulse 80, resp. rate (!) 24, height 5\' 4"  (1.626 m), weight 61.2 kg, SpO2 97 %. Physical Exam  Constitutional: He is oriented to person, place, and time. He appears well-developed and well-nourished. He appears distressed.  HENT:  Head: Normocephalic and atraumatic.  Right Ear: External ear normal.  Left Ear: External ear normal.  Nose: Nose normal.  Mouth/Throat: Oropharynx is clear and moist.  Eyes: Pupils are equal, round, and reactive to light. Conjunctivae are normal. Right eye exhibits no discharge. Left eye exhibits no discharge. No scleral icterus.  Neck: Neck supple. No thyromegaly present.  Cardiovascular: Normal rate, regular rhythm and intact distal pulses.  Respiratory: He is in respiratory distress. He exhibits tenderness.  Dramatic subcutaneous emphysema  GI: Soft. He exhibits no distension. There is no abdominal tenderness. There is no rebound and no  guarding.  Musculoskeletal: Normal range of motion.        General: No tenderness, deformity or edema.  Lymphadenopathy:    He has no cervical adenopathy.  Neurological: He is oriented to person, place, and time. Coordination normal.  Skin: Skin is warm and dry. No rash noted. He is not diaphoretic. No erythema. No pallor.     Several small superficial abrasions on back.    Psychiatric: He has a normal mood and affect. His behavior is normal. Judgment and thought content normal.     Assessment/Plan Fall from ladder Left hemopneumothorax Pneumomediastinum Significant subcutaneous emphysema Left 6-9 rib fractures, mildly displaced.  Right pleural effusion Emphysematous change in lungs.  Superficial abrasions to back.   Mild hyperkalemia   Admit to stepdown. Esophogram to r/o esophageal injury was negative Left chest tube to suction Pain control Pulmonary toilet Bowel regimen Bacitracin to back abrasions.   Recheck K in AM. Recheck CXR in AM. Main need right pleural effusion drained, but not consistent with blood, so likely not traumatic in origin.    Almond LintFaera Khan Chura 02/12/2019, 6:40 PM   Procedures

## 2019-02-12 NOTE — Op Note (Signed)
Chest Tube Insertion Procedure Note  Indications:  Clinically significant Pneumothorax  Pre-operative Diagnosis: Pneumothorax  Post-operative Diagnosis: Pneumothorax and Hemothorax  Procedure Details  Informed consent was obtained for the procedure.  This was done urgently.  After sterile skin prep, using standard technique, a 14 French tube was placed in the left anterolateral 5th rib space.  Findings: Large rush of air  Estimated Blood Loss:  Minimal         Specimens:  None              Complications:  None; patient tolerated the procedure well.         Disposition: Stayed in ED room         Condition: stable  Attending Attestation: I performed the procedure.

## 2019-02-12 NOTE — ED Notes (Signed)
Attempted to call wife Jackelyn Poling [phone] mailbox was full unable to leave message.

## 2019-02-12 NOTE — ED Notes (Signed)
Paged Trauma MD for orders. Pt is now in increased pain and needs scan.

## 2019-02-12 NOTE — ED Notes (Signed)
This RN spoke with wife and has given update and have updated her number in the chart.

## 2019-02-12 NOTE — ED Notes (Signed)
Patient transported to X-ray for DG Esophagus

## 2019-02-12 NOTE — ED Provider Notes (Addendum)
[facility] EMERGENCY DEPARTMENT Provider Note   CSN: [id number] Arrival date & time: 02/12/19  1118    History   Chief Complaint Chief Complaint  Patient presents with  . Trauma    HPI Jake Roach is a 53 y.o. male.     53 yo M with a chief complaint of left-sided chest pain.  The patient fell off of the 10 foot ladder yesterday.  Landed on his left side.  Denies head injury denies loss consciousness denies neck pain back pain extremity pain.  Felt that he was likely okay and went to bed and woke up this morning and felt like he was unable to breathe.  Eventually called 911.  Per EMS the patient with subcutaneous air and diminished breath sounds on the left.  Some JVD.  No venting of the chest.  The history is provided by the patient.  Trauma Mechanism of injury: fall Injury location: torso Injury location detail: L chest Incident location: home Time since incident: 1 day Arrived directly from scene: no   Fall:      Fall occurred: from a ladder      Height of fall: 10 gt      Impact surface: dirt      Point of impact: chest.      Entrapped after fall: no      Suspicion of alcohol use: no      Suspicion of drug use: no  EMS/PTA data:      Bystander interventions: none      Ambulatory at scene: yes      Blood loss: none      Responsiveness: alert      Oriented to: place, person, situation and time      Loss of consciousness: no      Amnesic to event: no      Airway interventions: none      Immobilization: C-collar      Airway condition since incident: stable      Breathing condition since incident: stable      Circulation condition since incident: stable  Current symptoms:      Associated symptoms:            Reports chest pain.            Denies abdominal pain, headache, loss of consciousness and vomiting.    History reviewed. No pertinent past medical history.  There are no active problems to display for this patient.   History  reviewed. No pertinent surgical history.      Home Medications    Prior to Admission medications   Not on File    Family History No family history on file.  Social History Social History   Tobacco Use  . Smoking status: Current Some Day Smoker  . Smokeless tobacco: Never Used  Substance Use Topics  . Alcohol use: Yes  . Drug use: Never     Allergies   Patient has no allergy information on record.   Review of Systems Review of Systems  Constitutional: Negative for chills and fever.  HENT: Negative for congestion and facial swelling.   Eyes: Negative for discharge and visual disturbance.  Respiratory: Positive for shortness of breath.   Cardiovascular: Positive for chest pain. Negative for palpitations.  Gastrointestinal: Negative for abdominal pain, diarrhea and vomiting.  Musculoskeletal: Negative for arthralgias and myalgias.  Skin: Negative for color change and rash.  Neurological: Negative for tremors, loss of consciousness, syncope and headaches.  Psychiatric/Behavioral: Negative  for confusion and dysphoric mood.     Physical Exam Updated Vital Signs Ht 5\' 4"  (1.626 m)   Wt 61.2 kg   SpO2 99%   BMI 23.17 kg/m   Physical Exam Vitals signs and nursing note reviewed.  Constitutional:      Appearance: He is well-developed.  HENT:     Head: Normocephalic and atraumatic.  Eyes:     Pupils: Pupils are equal, round, and reactive to light.  Neck:     Musculoskeletal: Normal range of motion and neck supple.     Vascular: No JVD.  Cardiovascular:     Rate and Rhythm: Normal rate and regular rhythm.     Heart sounds: No murmur. No friction rub. No gallop.   Pulmonary:     Effort: No respiratory distress.     Breath sounds: No wheezing.     Comments: Chest pain to the left chest wall. Subcutaneous emphysema.  Diminished breath sounds on the left.  JVD. Chest:     Chest wall: Tenderness present.  Abdominal:     General: There is no distension.      Tenderness: There is no guarding or rebound.  Musculoskeletal: Normal range of motion.  Skin:    Coloration: Skin is not pale.     Findings: No rash.  Neurological:     Mental Status: He is alert and oriented to person, place, and time.  Psychiatric:        Behavior: Behavior normal.      ED Treatments / Results  Labs (all labs ordered are listed, but only abnormal results are displayed) Labs Reviewed  SARS CORONAVIRUS 2 (HOSPITAL ORDER, PERFORMED IN [facility])  CDS SEROLOGY  COMPREHENSIVE METABOLIC PANEL  CBC  ETHANOL  PROTIME-INR  TYPE AND SCREEN  PREPARE FRESH FROZEN PLASMA  ABO/RH    EKG None  Radiology Dg Chest Portable 1 View  Result Date: 02/12/2019 CLINICAL DATA:  Status post left chest tube insertion. EXAM: PORTABLE CHEST 1 VIEW COMPARISON:  February 12, 2019 11:02 a.m. FINDINGS: The heart size and mediastinal contours are stable. There is interval insertion of left chest tube with significant interval decrease of previously noted left pneumothorax. Evaluation of the left lung is limited due to extensive subcutaneous emphysema over left chest and bilateral neck. Right pleural effusion is unchanged. The visualized skeletal structures are stable. IMPRESSION: Interval insertion of left chest tube with significant interval decrease of previously noted left pneumothorax. Evaluation of the left lung is limited due to extensive subcutaneous emphysema of left chest and bilateral neck. Electronically Signed   By: Sherian ReinWei-Chen  Lin M.D.   On: 02/12/2019 12:11   Dg Chest Portable 1 View  Result Date: 02/12/2019 CLINICAL DATA:  Patient status post fall from ladder. EXAM: PORTABLE CHEST 1 VIEW COMPARISON:  None. FINDINGS: Monitoring leads overlie the patient. Normal cardiac contours. Moderate left pneumothorax. Moderate right pleural effusion. Trace left pleural effusion. Consolidation throughout the lungs bilaterally. Pneumomediastinum. Extensive subcutaneous emphysema  overlying the neck and left chest wall soft tissues. Thoracic spine degenerative changes. Displaced left posterior and lateral lower rib fractures. IMPRESSION: 1. Moderate to large left pneumothorax. Subsequent chest radiograph demonstrates a left chest tube in place. 2. Findings suggestive of pneumomediastinum. 3. Moderate right pleural effusion.  Trace left pleural effusion. 4. Consolidative opacities throughout the lungs bilaterally which may represent contusion or edema. 5. Extensive subcutaneous emphysema overlying the lower neck and left chest wall. 6. Multiple left-sided rib fractures. Electronically Signed   By:  Annia Beltrew  Davis M.D.   On: 02/12/2019 12:03    Procedures Procedures (including critical care time)  Medications Ordered in ED Medications  fentaNYL (SUBLIMAZE) 100 MCG/2ML injection (has no administration in time range)     Initial Impression / Assessment and Plan / ED Course  I have reviewed the triage vital signs and the nursing notes.  Pertinent labs & imaging results that were available during my care of the patient were reviewed by me and considered in my medical decision making (see chart for details).        53 yo M with a chief complaint of left-sided chest pain.  Occurred after a fall from 10 feet yesterday.  Patient with significant tachypnea in route and hypoxic made him a level 1.  Found to have a pneumothorax on arrival.  Chest tube placed by trauma surgery.  Will admit.  CRITICAL CARE Performed by: Rae Roamaniel Patrick Trell Secrist   Total critical care time: 35 minutes  Critical care time was exclusive of separately billable procedures and treating other patients.  Critical care was necessary to treat or prevent imminent or life-threatening deterioration.  Critical care was time spent personally by me on the following activities: development of treatment plan with patient and/or surrogate as well as nursing, discussions with consultants, evaluation of patient's  response to treatment, examination of patient, obtaining history from patient or surrogate, ordering and performing treatments and interventions, ordering and review of laboratory studies, ordering and review of radiographic studies, pulse oximetry and re-evaluation of patient's condition.   Final Clinical Impressions(s) / ED Diagnoses   Final diagnoses:  Traumatic pneumothorax, initial encounter  Fall from ladder, initial encounter    ED Discharge Orders    None       Melene PlanFloyd, Mikaylah Libbey, DO 02/12/19 1241    Melene PlanFloyd, Matheus Spiker, DO 02/12/19 1242

## 2019-02-12 NOTE — Progress Notes (Signed)
Pt came from ER  Ct in place to left side . Made comfortable in bed , ivf set up and infusing  Has pain , p./s6 , medicated for same. Care continues.

## 2019-02-12 NOTE — ED Notes (Signed)
Trauma paged waiting orders.

## 2019-02-12 NOTE — ED Notes (Signed)
Transported back to CT.

## 2019-02-12 NOTE — ED Notes (Signed)
Dr, Barry Dienes at bedside 14 pig tail left chest connected sahara.

## 2019-02-13 ENCOUNTER — Inpatient Hospital Stay (HOSPITAL_COMMUNITY): Payer: Managed Care, Other (non HMO)

## 2019-02-13 LAB — BODY FLUID CELL COUNT WITH DIFFERENTIAL
Eos, Fluid: 1 %
Lymphs, Fluid: 84 %
Monocyte-Macrophage-Serous Fluid: 0 % — ABNORMAL LOW (ref 50–90)
Neutrophil Count, Fluid: 15 % (ref 0–25)
Total Nucleated Cell Count, Fluid: 5293 cu mm — ABNORMAL HIGH (ref 0–1000)

## 2019-02-13 LAB — CBC
HCT: 45.3 % (ref 39.0–52.0)
Hemoglobin: 15.6 g/dL (ref 13.0–17.0)
MCH: 30.5 pg (ref 26.0–34.0)
MCHC: 34.4 g/dL (ref 30.0–36.0)
MCV: 88.5 fL (ref 80.0–100.0)
Platelets: 245 10*3/uL (ref 150–400)
RBC: 5.12 MIL/uL (ref 4.22–5.81)
RDW: 12.4 % (ref 11.5–15.5)
WBC: 18.2 10*3/uL — ABNORMAL HIGH (ref 4.0–10.5)
nRBC: 0 % (ref 0.0–0.2)

## 2019-02-13 LAB — LACTATE DEHYDROGENASE, PLEURAL OR PERITONEAL FLUID: LD, Fluid: 259 U/L — ABNORMAL HIGH (ref 3–23)

## 2019-02-13 LAB — BASIC METABOLIC PANEL
Anion gap: 10 (ref 5–15)
BUN: 29 mg/dL — ABNORMAL HIGH (ref 6–20)
CO2: 22 mmol/L (ref 22–32)
Calcium: 8.8 mg/dL — ABNORMAL LOW (ref 8.9–10.3)
Chloride: 104 mmol/L (ref 98–111)
Creatinine, Ser: 1.38 mg/dL — ABNORMAL HIGH (ref 0.61–1.24)
GFR calc Af Amer: >60 mL/min (ref >60)
GFR calc non Af Amer: 58 mL/min — ABNORMAL LOW (ref >60)
Glucose, Bld: 144 mg/dL — ABNORMAL HIGH (ref 70–99)
Potassium: 4.9 mmol/L (ref 3.5–5.1)
Sodium: 136 mmol/L (ref 135–145)

## 2019-02-13 LAB — GRAM STAIN

## 2019-02-13 LAB — PROTEIN, PLEURAL OR PERITONEAL FLUID: Total protein, fluid: 3.6 g/dL

## 2019-02-13 LAB — HIV ANTIBODY (ROUTINE TESTING W REFLEX): HIV Screen 4th Generation wRfx: NONREACTIVE

## 2019-02-13 LAB — GLUCOSE, PLEURAL OR PERITONEAL FLUID: Glucose, Fluid: 112 mg/dL

## 2019-02-13 MED ORDER — ACETAMINOPHEN 500 MG PO TABS
1000.0000 mg | ORAL_TABLET | Freq: Four times a day (QID) | ORAL | Status: DC
  Administered 2019-02-13 – 2019-02-16 (×12): 1000 mg via ORAL
  Filled 2019-02-13 (×13): qty 2

## 2019-02-13 MED ORDER — METHOCARBAMOL 500 MG PO TABS
750.0000 mg | ORAL_TABLET | Freq: Three times a day (TID) | ORAL | Status: DC
  Administered 2019-02-13 – 2019-02-16 (×9): 750 mg via ORAL
  Filled 2019-02-13 (×9): qty 2

## 2019-02-13 NOTE — Procedures (Signed)
PROCEDURE SUMMARY:  Successful US guided right thoracentesis. Yielded 1.0 liters of bloody fluid. Pt tolerated procedure well. No immediate complications.  Specimen was sent for labs. CXR ordered.  EBL < 5 mL  Docia Barrier PA-C 02/13/2019 12:41 PM

## 2019-02-13 NOTE — Plan of Care (Signed)
Continue to monitor

## 2019-02-13 NOTE — Evaluation (Signed)
Physical Therapy Evaluation Patient Details Name: Jake Roach MRN: [id number] DOB: [birth year] Today's Date: 02/13/2019   History of Present Illness  Pt is a 53 y.o. M with no significant PMH who presents after a fall off of a ladder with left hemopneumothorax, pneumomediastinum/subcutaneous emphysema, left 6-9 rib fractures, and right pleural effusion s/p thoracentesis 7/4.  Clinical Impression  Pt admitted with above. 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 Pt verbalizing good pain control. Ambulating in room with no assistive device without difficulty, positioned in chair to promote upright. SpO2 98-99% on RA. Education re: activity recommendations, pillow splinting for pain, and IS use. Will benefit from further mobilization. No PT follow up recommended.     Follow Up Recommendations No PT follow up    Equipment Recommendations  None recommended by PT    Recommendations for Other Services       Precautions / Restrictions Precautions Precautions: Other (comment) Precaution Comments: chest tube Restrictions Weight Bearing Restrictions: No      Mobility  Bed Mobility Overal bed mobility: Independent                Transfers Overall transfer level: Independent Equipment used: None                Ambulation/Gait Ambulation/Gait assistance: Supervision Gait Distance (Feet): 30 Feet Assistive device: None Gait Pattern/deviations: WFL(Within Functional Limits)     General Gait Details: Supervision for lines  Stairs            Wheelchair Mobility    Modified Rankin (Stroke Patients Only)       Balance Overall balance assessment: No apparent balance deficits (not formally assessed)                                           Pertinent Vitals/Pain Pain Assessment: Faces Faces Pain Scale: Hurts a little bit Pain Location: left side with coughing Pain Descriptors / Indicators:  Guarding;Grimacing    Home Living Family/patient expects to be discharged to:: Private residence Living Arrangements: Spouse/significant other;Children(son) Available Help at Discharge: Family Type of Home: House Home Access: Stairs to enter   Technical brewer of Steps: (flight) Home Layout: One level        Prior Function Level of Independence: Independent         Comments: works as a Oceanographer Extremity Assessment Upper Extremity Assessment: Overall WFL for tasks assessed    Lower Extremity Assessment Lower Extremity Assessment: Overall WFL for tasks assessed    Cervical / Trunk Assessment Cervical / Trunk Assessment: Normal  Communication   Communication: No difficulties  Cognition Arousal/Alertness: Awake/alert Behavior During Therapy: WFL for tasks assessed/performed Overall Cognitive Status: Within Functional Limits for tasks assessed                                 General Comments: WFL for basic mobility tasks, not very engaged in conversation      General Comments      Exercises     Assessment/Plan    PT Assessment Patient needs continued PT services  PT Problem List Decreased mobility;Pain       PT Treatment Interventions Stair training;Gait training;Functional mobility training;Therapeutic activities;Therapeutic  exercise;Balance training;Patient/family education    PT Goals (Current goals can be found in the Care Plan section)  Acute Rehab PT Goals Patient Stated Goal: "be up." PT Goal Formulation: With patient Time For Goal Achievement: 02/27/19 Potential to Achieve Goals: Good    Frequency Min 3X/week   Barriers to discharge        Co-evaluation               AM-PAC PT "6 Clicks" Mobility  Outcome Measure Help needed turning from your back to your side while in a flat bed without using bedrails?: None Help needed moving from lying on your  back to sitting on the side of a flat bed without using bedrails?: None Help needed moving to and from a bed to a chair (including a wheelchair)?: None Help needed standing up from a chair using your arms (e.g., wheelchair or bedside chair)?: None Help needed to walk in hospital room?: None Help needed climbing 3-5 steps with a railing? : A Little 6 Click Score: 23    End of Session   Activity Tolerance: Patient tolerated treatment well Patient left: in chair;with call bell/phone within reach Nurse Communication: Mobility status PT Visit Diagnosis: Pain Pain - part of body: (flank)    Time: [id number] PT Time Calculation (min) (ACUTE ONLY): 20 min   Charges:   PT Evaluation $PT Eval Low Complexity: 1 Low          Laurina Bustlearoline Saylor Sheckler, PT, DPT Acute Rehabilitation Services Pager [phone] Office [phone]   Vanetta MuldersCarloine H Jashawn Floyd 02/13/2019, 4:44 PM

## 2019-02-13 NOTE — Progress Notes (Signed)
Patient's wife updated on patient status at this time.

## 2019-02-13 NOTE — Progress Notes (Addendum)
Patient ID: Jake Roach, male   DOB: 11/05/1965, 52 y.o.   MRN: [id number]       Subjective: Laying in bed, mostly with pain in his chest on the left side, but otherwise ok.  Denies right-sided chest pain or anything more than his typical "smoker's cough" prior to this admit.  Smokes at least 1 ppd.  Urinating some but dark.  Normally drinks a ton of coffee in a day.  Objective: Vital signs in last 24 hours: Temp:  [97.9 F (36.6 C)-98.8 F (37.1 C)] 98.8 F (37.1 C) (07/04 0829) Pulse Rate:  [76-94] 78 (07/04 0829) Resp:  [13-24] 16 (07/04 0829) BP: (126-164)/(83-129) 135/87 (07/04 0829) SpO2:  [93 %-100 %] 97 % (07/04 0829) Weight:  [61.2 kg] 61.2 kg (07/03 1205)    Intake/Output from previous day: 07/03 0701 - 07/04 0700 In: 1078.7 [P.O.:200; I.V.:878.7] Out: 450 [Urine:450] Intake/Output this shift: Total I/O In: -  Out: 720 [Urine:700; Chest Tube:20]  PE: Gen: NAD Heart: regular Lungs/chest: diffuse rhonchi noted bilaterally, CT in place on left side with no airleak.  Minimal output. Site is c/d/i.  Crepitus noted greatest on left chest wall. Abd: soft, NT, ND, +BS Ext: MAE, NVI  Lab Results:  Recent Labs    02/12/19 1759 02/13/19 0155  WBC 18.5* 18.2*  HGB 16.3 15.6  HCT 46.5 45.3  PLT 263 245   BMET Recent Labs    02/12/19 1226 02/12/19 1759 02/13/19 0155  NA 135  --  136  K 5.4*  --  4.9  CL 103  --  104  CO2 21*  --  22  GLUCOSE 136*  --  144*  BUN 20  --  29*  CREATININE 1.06 1.21 1.38*  CALCIUM 8.8*  --  8.8*   PT/INR Recent Labs    02/12/19 1226  LABPROT 12.8  INR 1.0   CMP     Component Value Date/Time   NA 136 02/13/2019 0155   K 4.9 02/13/2019 0155   CL 104 02/13/2019 0155   CO2 22 02/13/2019 0155   GLUCOSE 144 (H) 02/13/2019 0155   BUN 29 (H) 02/13/2019 0155   CREATININE 1.38 (H) 02/13/2019 0155   CALCIUM 8.8 (L) 02/13/2019 0155   PROT 6.9 02/12/2019 1226   ALBUMIN 3.8 02/12/2019 1226   AST 39 02/12/2019 1226   ALT  22 02/12/2019 1226   ALKPHOS 79 02/12/2019 1226   BILITOT 1.2 02/12/2019 1226   GFRNONAA 58 (L) 02/13/2019 0155   GFRAA >60 02/13/2019 0155   Lipase  No results found for: LIPASE     Studies/Results: Ct Head Wo Contrast  Result Date: 02/12/2019 CLINICAL DATA:  Patient status post fall from a ladder. EXAM: CT HEAD WITHOUT CONTRAST CT CERVICAL SPINE WITHOUT CONTRAST TECHNIQUE: Multidetector CT imaging of the head and cervical spine was performed following the standard protocol without intravenous contrast. Multiplanar CT image reconstructions of the cervical spine were also generated. COMPARISON:  None. FINDINGS: CT HEAD FINDINGS Brain: Ventricles and sulci are appropriate for patient's age. No evidence for acute cortically based infarct, intracranial hemorrhage, mass lesion or mass-effect. Vascular: Unremarkable Skull: Intact Sinuses/Orbits: Right maxillary sinus mucosal thickening. Mastoid air cells are unremarkable. Orbits are unremarkable. Other: Extensive gas within the soft tissues about the skull base. CT CERVICAL SPINE FINDINGS Alignment: Normal anatomic alignment. Skull base and vertebrae: No acute fracture. No primary bone lesion or focal pathologic process. Soft tissues and spinal canal: No prevertebral fluid or swelling. No visible canal hematoma.  Disc levels: Degenerative disc disease most pronounced C5-6 and C6-7. Left-sided facet degenerative changes C2-3. No acute fracture. Upper chest: There is an incompletely visualized left apical pneumothorax. There is a layering right hemothorax. Pneumomediastinum. Extensive gas throughout the cervical soft tissues. Other: None IMPRESSION: 1. There is a moderate left apical pneumothorax. Left chest tube is visualizedon scout. 2. Layering right hemothorax, incompletely visualized. 3. Pneumomediastinum. 4. Extensive gas throughout the visualized cervical soft tissues. 5. No acute intracranial process. 6. No acute cervical spine fracture. 7.  Degenerative disc disease. Electronically Signed   By: Annia Beltrew  Davis M.D.   On: 02/12/2019 13:09   Ct Chest W Contrast  Result Date: 02/12/2019 CLINICAL DATA:  Blunt abdominal trauma. EXAM: CT CHEST, ABDOMEN, AND PELVIS WITH CONTRAST TECHNIQUE: Multidetector CT imaging of the chest, abdomen and pelvis was performed following the standard protocol during bolus administration of intravenous contrast. CONTRAST:  100mL ISOVUE-300 IOPAMIDOL (ISOVUE-300) INJECTION 61% COMPARISON:  None. FINDINGS: CT CHEST FINDINGS Cardiovascular: No significant vascular findings. Normal heart size. No pericardial effusion. Mediastinum/Nodes: Extensive pneumomediastinum is noted which extends into the cervical soft tissues, bilateral supraclavicular area and along the left chest wall. Thyroid gland is unremarkable. No definite adenopathy is noted. Lungs/Pleura: Large right pleural effusion is noted. Emphysematous disease is noted. Pigtail catheter is noted anteriorly in the left upper lobe with minimal left apical pneumothorax. Mild left posterior basilar atelectasis is noted. Musculoskeletal: As noted above, extensive subcutaneous emphysema is seen involving the left lateral chest wall mildly displaced fractures are seen involving the left sixth, seventh, eighth, ninth and tenth ribs posteriorly. CT ABDOMEN PELVIS FINDINGS Hepatobiliary: No focal liver abnormality is seen. No gallstones, gallbladder wall thickening, or biliary dilatation. Pancreas: Unremarkable. No pancreatic ductal dilatation or surrounding inflammatory changes. Spleen: Normal in size without focal abnormality. Adrenals/Urinary Tract: Adrenal glands are unremarkable. Kidneys are normal, without renal calculi, focal lesion, or hydronephrosis. Bladder is unremarkable. Stomach/Bowel: Stomach is within normal limits. Appendix appears normal. No evidence of bowel wall thickening, distention, or inflammatory changes. Vascular/Lymphatic: No significant vascular findings are  present. No enlarged abdominal or pelvic lymph nodes. Reproductive: Prostate is unremarkable. Other: Extensive subcutaneous emphysema is seen involving both lateral abdominal walls extending into the lower anterior abdominal and pelvic wall and scrotum. Musculoskeletal: No acute or significant osseous findings. IMPRESSION: Extensive pneumomediastinum is noted concerning for possible esophageal, tracheal or bronchial injury. This is seen to extend into the cervical soft tissues, as well as the bilateral supraclavicular areas and along the left lateral chest wall. It is also noted to extend into both lateral abdominal walls and into the lower abdominal and pelvic wall and scrotum. Pigtail left-sided chest tube is noted with minimal left apical pneumothorax. Large right pleural effusion is noted. Mild left posterior basilar subsegmental atelectasis is noted. Mildly displaced left sixth, seventh, eighth, ninth and tenth rib fractures are noted. Emphysema (ICD10-J43.9). Electronically Signed   By: Lupita RaiderJames  Green Jr M.D.   On: 02/12/2019 14:46   Ct Cervical Spine Wo Contrast  Result Date: 02/12/2019 CLINICAL DATA:  Patient status post fall from a ladder. EXAM: CT HEAD WITHOUT CONTRAST CT CERVICAL SPINE WITHOUT CONTRAST TECHNIQUE: Multidetector CT imaging of the head and cervical spine was performed following the standard protocol without intravenous contrast. Multiplanar CT image reconstructions of the cervical spine were also generated. COMPARISON:  None. FINDINGS: CT HEAD FINDINGS Brain: Ventricles and sulci are appropriate for patient's age. No evidence for acute cortically based infarct, intracranial hemorrhage, mass lesion or mass-effect. Vascular: Unremarkable Skull:  Intact Sinuses/Orbits: Right maxillary sinus mucosal thickening. Mastoid air cells are unremarkable. Orbits are unremarkable. Other: Extensive gas within the soft tissues about the skull base. CT CERVICAL SPINE FINDINGS Alignment: Normal anatomic  alignment. Skull base and vertebrae: No acute fracture. No primary bone lesion or focal pathologic process. Soft tissues and spinal canal: No prevertebral fluid or swelling. No visible canal hematoma. Disc levels: Degenerative disc disease most pronounced C5-6 and C6-7. Left-sided facet degenerative changes C2-3. No acute fracture. Upper chest: There is an incompletely visualized left apical pneumothorax. There is a layering right hemothorax. Pneumomediastinum. Extensive gas throughout the cervical soft tissues. Other: None IMPRESSION: 1. There is a moderate left apical pneumothorax. Left chest tube is visualizedon scout. 2. Layering right hemothorax, incompletely visualized. 3. Pneumomediastinum. 4. Extensive gas throughout the visualized cervical soft tissues. 5. No acute intracranial process. 6. No acute cervical spine fracture. 7. Degenerative disc disease. Electronically Signed   By: Annia Beltrew  Davis M.D.   On: 02/12/2019 13:09   Ct Abdomen Pelvis W Contrast  Result Date: 02/12/2019 CLINICAL DATA:  Blunt abdominal trauma. EXAM: CT CHEST, ABDOMEN, AND PELVIS WITH CONTRAST TECHNIQUE: Multidetector CT imaging of the chest, abdomen and pelvis was performed following the standard protocol during bolus administration of intravenous contrast. CONTRAST:  100mL ISOVUE-300 IOPAMIDOL (ISOVUE-300) INJECTION 61% COMPARISON:  None. FINDINGS: CT CHEST FINDINGS Cardiovascular: No significant vascular findings. Normal heart size. No pericardial effusion. Mediastinum/Nodes: Extensive pneumomediastinum is noted which extends into the cervical soft tissues, bilateral supraclavicular area and along the left chest wall. Thyroid gland is unremarkable. No definite adenopathy is noted. Lungs/Pleura: Large right pleural effusion is noted. Emphysematous disease is noted. Pigtail catheter is noted anteriorly in the left upper lobe with minimal left apical pneumothorax. Mild left posterior basilar atelectasis is noted. Musculoskeletal: As  noted above, extensive subcutaneous emphysema is seen involving the left lateral chest wall mildly displaced fractures are seen involving the left sixth, seventh, eighth, ninth and tenth ribs posteriorly. CT ABDOMEN PELVIS FINDINGS Hepatobiliary: No focal liver abnormality is seen. No gallstones, gallbladder wall thickening, or biliary dilatation. Pancreas: Unremarkable. No pancreatic ductal dilatation or surrounding inflammatory changes. Spleen: Normal in size without focal abnormality. Adrenals/Urinary Tract: Adrenal glands are unremarkable. Kidneys are normal, without renal calculi, focal lesion, or hydronephrosis. Bladder is unremarkable. Stomach/Bowel: Stomach is within normal limits. Appendix appears normal. No evidence of bowel wall thickening, distention, or inflammatory changes. Vascular/Lymphatic: No significant vascular findings are present. No enlarged abdominal or pelvic lymph nodes. Reproductive: Prostate is unremarkable. Other: Extensive subcutaneous emphysema is seen involving both lateral abdominal walls extending into the lower anterior abdominal and pelvic wall and scrotum. Musculoskeletal: No acute or significant osseous findings. IMPRESSION: Extensive pneumomediastinum is noted concerning for possible esophageal, tracheal or bronchial injury. This is seen to extend into the cervical soft tissues, as well as the bilateral supraclavicular areas and along the left lateral chest wall. It is also noted to extend into both lateral abdominal walls and into the lower abdominal and pelvic wall and scrotum. Pigtail left-sided chest tube is noted with minimal left apical pneumothorax. Large right pleural effusion is noted. Mild left posterior basilar subsegmental atelectasis is noted. Mildly displaced left sixth, seventh, eighth, ninth and tenth rib fractures are noted. Emphysema (ICD10-J43.9). Electronically Signed   By: Lupita RaiderJames  Green Jr M.D.   On: 02/12/2019 14:46   Dg Chest Portable 1 View  Result  Date: 02/12/2019 CLINICAL DATA:  Status post left chest tube insertion. EXAM: PORTABLE CHEST 1 VIEW COMPARISON:  February 12, 2019 11:02 a.m. FINDINGS: The heart size and mediastinal contours are stable. There is interval insertion of left chest tube with significant interval decrease of previously noted left pneumothorax. Evaluation of the left lung is limited due to extensive subcutaneous emphysema over left chest and bilateral neck. Right pleural effusion is unchanged. The visualized skeletal structures are stable. IMPRESSION: Interval insertion of left chest tube with significant interval decrease of previously noted left pneumothorax. Evaluation of the left lung is limited due to extensive subcutaneous emphysema of left chest and bilateral neck. Electronically Signed   By: Sherian ReinWei-Chen  Lin M.D.   On: 02/12/2019 12:11   Dg Chest Portable 1 View  Result Date: 02/12/2019 CLINICAL DATA:  Patient status post fall from ladder. EXAM: PORTABLE CHEST 1 VIEW COMPARISON:  None. FINDINGS: Monitoring leads overlie the patient. Normal cardiac contours. Moderate left pneumothorax. Moderate right pleural effusion. Trace left pleural effusion. Consolidation throughout the lungs bilaterally. Pneumomediastinum. Extensive subcutaneous emphysema overlying the neck and left chest wall soft tissues. Thoracic spine degenerative changes. Displaced left posterior and lateral lower rib fractures. IMPRESSION: 1. Moderate to large left pneumothorax. Subsequent chest radiograph demonstrates a left chest tube in place. 2. Findings suggestive of pneumomediastinum. 3. Moderate right pleural effusion.  Trace left pleural effusion. 4. Consolidative opacities throughout the lungs bilaterally which may represent contusion or edema. 5. Extensive subcutaneous emphysema overlying the lower neck and left chest wall. 6. Multiple left-sided rib fractures. Electronically Signed   By: Annia Beltrew  Davis M.D.   On: 02/12/2019 12:03   Dg Esophagus W Single Cm (sol Or  Thin Ba)  Result Date: 02/12/2019 CLINICAL DATA:  Chest trauma EXAM: ESOPHOGRAM/BARIUM SWALLOW TECHNIQUE: Single contrast examination was performed using water-soluble contrast; Omnipaque 300. FLUOROSCOPY TIME:  Fluoroscopy Time:  30 seconds Radiation Exposure Index (if provided by the fluoroscopic device): Not applicable. Number of Acquired Spot Images: 0 COMPARISON:  CT of earlier today FINDINGS: Focused, single-contrast exam performed with patient in mild LPO and RPO positioning. Preprocedure scout film demonstrates a left-sided chest tube in place with pneumomediastinum. Full column evaluation of the esophagus demonstrates no contrast extravasation to suggest perforation. No obstruction to contrast passage. No pleural contrast at the end of the exam. IMPRESSION: No evidence of esophageal perforation. Electronically Signed   By: Jeronimo GreavesKyle  Talbot M.D.   On: 02/12/2019 17:08    Anti-infectives: Anti-infectives (From admission, onward)   None       Assessment/Plan Fall from ladder Left hemopneumothorax - cont CT on suction today.  X-ray noted read yet but lung appears inflated and good, repeat CXR in am, hopefully waterseal at that time Pneumomediastinum/Significant subcutaneous emphysema - likely secondary to PTX, esophagram negative for leak Left 6-9 rib fractures, mildly displaced. - pain control, pulm toilet, IS, mobilize Right pleural effusion - this is not likely related to his fall.  Will have IR do thoracentesis today and have labs and cyto checked.  Could also be related to esophageal perf if he had one and it sealed by the time he got his esophagram Emphysematous change in lungs/tobacco abuse - pulm toilet, IS Superficial abrasions to back.   Mild hyperkalemia - resolved Acute renal insufficiency - cr up to 1.38 from 1.06 on admit.  Will increase IVFs to 125cc/hr while NPO FEN - IVFs, NPO until after thora VTE - Lovenox ID - none   LOS: 1 day    Letha CapeKelly E Emmanuelle Hibbitts , [facility] 02/13/2019, 8:51 AM Pager: [phone]

## 2019-02-14 ENCOUNTER — Inpatient Hospital Stay (HOSPITAL_COMMUNITY): Payer: Managed Care, Other (non HMO)

## 2019-02-14 LAB — BASIC METABOLIC PANEL
Anion gap: 7 (ref 5–15)
BUN: 18 mg/dL (ref 6–20)
CO2: 22 mmol/L (ref 22–32)
Calcium: 8 mg/dL — ABNORMAL LOW (ref 8.9–10.3)
Chloride: 104 mmol/L (ref 98–111)
Creatinine, Ser: 0.8 mg/dL (ref 0.61–1.24)
GFR calc Af Amer: >60 mL/min (ref >60)
GFR calc non Af Amer: >60 mL/min (ref >60)
Glucose, Bld: 116 mg/dL — ABNORMAL HIGH (ref 70–99)
Potassium: 3.9 mmol/L (ref 3.5–5.1)
Sodium: 133 mmol/L — ABNORMAL LOW (ref 135–145)

## 2019-02-14 LAB — BLOOD PRODUCT ORDER (VERBAL) VERIFICATION

## 2019-02-14 LAB — CDS SEROLOGY

## 2019-02-14 LAB — CBC
HCT: 35.7 % — ABNORMAL LOW (ref 39.0–52.0)
Hemoglobin: 12.5 g/dL — ABNORMAL LOW (ref 13.0–17.0)
MCH: 31 pg (ref 26.0–34.0)
MCHC: 35 g/dL (ref 30.0–36.0)
MCV: 88.6 fL (ref 80.0–100.0)
Platelets: 183 10*3/uL (ref 150–400)
RBC: 4.03 MIL/uL — ABNORMAL LOW (ref 4.22–5.81)
RDW: 12.4 % (ref 11.5–15.5)
WBC: 10.7 10*3/uL — ABNORMAL HIGH (ref 4.0–10.5)
nRBC: 0 % (ref 0.0–0.2)

## 2019-02-14 NOTE — Progress Notes (Signed)
OT Cancellation Note  Patient Details Name: Jake Roach MRN: [id number] DOB: 1966-01-17   Cancelled Treatment:    Reason Eval/Treat Not Completed: OT screened, no needs identified, will sign off.  Pt reports he has been up ambulating in hallways and is able to access feet for LB ADLs.   No OT indicated at this time.  Lucille Passy, OTR/L Acute Rehabilitation Services Pager [phone] Office [phone]   Lucille Passy M 02/14/2019, 10:34 AM

## 2019-02-14 NOTE — Progress Notes (Signed)
Physical Therapy Treatment Patient Details Name: Jake Roach MRN: [id number] DOB: [birth year] Today's Date: 02/14/2019    History of Present Illness Pt is a 53 y.o. M with no significant PMH who presents after a fall off of a ladder with left hemopneumothorax, pneumomediastinum/subcutaneous emphysema, left 6-9 rib fractures, and right pleural effusion s/p thoracentesis 7/4.    PT Comments    Pt admitted with above diagnosis. Pt currently with functional limitations due to the deficits listed below (see PT Problem List). Pt was able to ambulate in hallway without device with supervision.  Pt without LOB but does have somewhat guarded gait.  Pt needs continued therapy.  Pt will benefit from skilled PT to increase their independence and safety with mobility to allow discharge to the venue listed below.     Follow Up Recommendations  No PT follow up     Equipment Recommendations  None recommended by PT    Recommendations for Other Services       Precautions / Restrictions Precautions Precautions: Other (comment) Precaution Comments: chest tube Restrictions Weight Bearing Restrictions: No    Mobility  Bed Mobility Overal bed mobility: Independent                Transfers Overall transfer level: Independent Equipment used: None                Ambulation/Gait Ambulation/Gait assistance: Supervision;Min guard Gait Distance (Feet): 250 Feet Assistive device: None Gait Pattern/deviations: WFL(Within Functional Limits)   Gait velocity interpretation: 1.31 - 2.62 ft/sec, indicative of limited community ambulator General Gait Details: Supervision for lines.  Had to take a few standing rest breaks when pt would cough as he would have pain.  Desat to 89% on RA with activity but quickly recovered to 93%.     Stairs             Wheelchair Mobility    Modified Rankin (Stroke Patients Only)       Balance Overall balance assessment: No apparent balance  deficits (not formally assessed)                                          Cognition Arousal/Alertness: Awake/alert Behavior During Therapy: WFL for tasks assessed/performed Overall Cognitive Status: Within Functional Limits for tasks assessed                                 General Comments: WFL for basic mobility tasks, not very engaged in conversation      Exercises General Exercises - Lower Extremity Ankle Circles/Pumps: AROM;Both;10 reps;Supine Long Arc Quad: AROM;Both;10 reps;Seated    General Comments        Pertinent Vitals/Pain Pain Assessment: Faces Faces Pain Scale: Hurts even more Pain Location: left side with coughing Pain Descriptors / Indicators: Guarding;Grimacing Pain Intervention(s): Limited activity within patient's tolerance;Monitored during session;Repositioned    Home Living                      Prior Function            PT Goals (current goals can now be found in the care plan section) Acute Rehab PT Goals Patient Stated Goal: "be up." Progress towards PT goals: Progressing toward goals    Frequency    Min 3X/week      PT Plan  Current plan remains appropriate    Co-evaluation              AM-PAC PT "6 Clicks" Mobility   Outcome Measure  Help needed turning from your back to your side while in a flat bed without using bedrails?: None Help needed moving from lying on your back to sitting on the side of a flat bed without using bedrails?: None Help needed moving to and from a bed to a chair (including a wheelchair)?: None Help needed standing up from a chair using your arms (e.g., wheelchair or bedside chair)?: None Help needed to walk in hospital room?: None Help needed climbing 3-5 steps with a railing? : A Little 6 Click Score: 23    End of Session Equipment Utilized During Treatment: Gait belt Activity Tolerance: Patient tolerated treatment well Patient left: with call bell/phone  within reach;in bed;with bed alarm set Nurse Communication: Mobility status PT Visit Diagnosis: Pain Pain - part of body: (flank)     Time: [id number] PT Time Calculation (min) (ACUTE ONLY): 15 min  Charges:  $Gait Training: 8-22 mins                     Lynton Crescenzo,PT Acute Rehabilitation Services Pager:  [phone]  Office:  [phone]     Berline LopesDawn F Ladaysha Soutar 02/14/2019, 1:12 PM

## 2019-02-14 NOTE — Progress Notes (Signed)
Subjective/Chief Complaint: Still with moderate chest pain Denies SOB  CT with 70 cc out   Objective: Vital signs in last 24 hours: Temp:  [98.3 F (36.8 C)-99 F (37.2 C)] 98.6 F (37 C) (07/05 0726) Pulse Rate:  [65-80] 71 (07/05 0726) Resp:  [13-20] 19 (07/05 0726) BP: (119-139)/(70-78) 128/70 (07/05 0726) SpO2:  [93 %-100 %] 93 % (07/05 0726)    Intake/Output from previous day: 07/04 0701 - 07/05 0700 In: 2282.9 [I.V.:2282.9] Out: 1500 [Urine:1430; Chest Tube:70] Intake/Output this shift: No intake/output data recorded.  Exam: Awake and alert Lungs clear with some crepitus on the left.  Lab Results:  Recent Labs    02/13/19 0155 02/14/19 0329  WBC 18.2* 10.7*  HGB 15.6 12.5*  HCT 45.3 35.7*  PLT 245 183   BMET Recent Labs    02/13/19 0155 02/14/19 0329  NA 136 133*  K 4.9 3.9  CL 104 104  CO2 22 22  GLUCOSE 144* 116*  BUN 29* 18  CREATININE 1.38* 0.80  CALCIUM 8.8* 8.0*   PT/INR Recent Labs    02/12/19 1226  LABPROT 12.8  INR 1.0   ABG No results for input(s): PHART, HCO3 in the last 72 hours.  Invalid input(s): PCO2, PO2  Studies/Results: Dg Chest 1 View  Result Date: 02/13/2019 CLINICAL DATA:  Status post right thoracentesis EXAM: CHEST  1 VIEW COMPARISON:  Chest radiograph from earlier today. FINDINGS: Stable apical left chest tube position. Stable cardiomediastinal silhouette with normal heart size. No right pneumothorax. Tiny medial/apical left pneumothorax, decreased. Trace residual right pleural effusion, decreased. No left pleural effusion. No pulmonary edema. Patchy medial bibasilar lung opacities, slightly decreased on the right. Retained oral contrast in the visualized colon. Subcutaneous emphysema throughout the left chest wall and bilateral lower neck, stable. IMPRESSION: 1. Trace residual right pleural effusion, decreased. No right pneumothorax. 2. Tiny medial apical left pneumothorax, decreased. Left apical chest tube in  place. 3. Patchy medial bibasilar lung opacities, slightly decreased on the right. Electronically Signed   By: Ilona Sorrel M.D.   On: 02/13/2019 13:09   Ct Head Wo Contrast  Result Date: 02/12/2019 CLINICAL DATA:  Patient status post fall from a ladder. EXAM: CT HEAD WITHOUT CONTRAST CT CERVICAL SPINE WITHOUT CONTRAST TECHNIQUE: Multidetector CT imaging of the head and cervical spine was performed following the standard protocol without intravenous contrast. Multiplanar CT image reconstructions of the cervical spine were also generated. COMPARISON:  None. FINDINGS: CT HEAD FINDINGS Brain: Ventricles and sulci are appropriate for patient's age. No evidence for acute cortically based infarct, intracranial hemorrhage, mass lesion or mass-effect. Vascular: Unremarkable Skull: Intact Sinuses/Orbits: Right maxillary sinus mucosal thickening. Mastoid air cells are unremarkable. Orbits are unremarkable. Other: Extensive gas within the soft tissues about the skull base. CT CERVICAL SPINE FINDINGS Alignment: Normal anatomic alignment. Skull base and vertebrae: No acute fracture. No primary bone lesion or focal pathologic process. Soft tissues and spinal canal: No prevertebral fluid or swelling. No visible canal hematoma. Disc levels: Degenerative disc disease most pronounced C5-6 and C6-7. Left-sided facet degenerative changes C2-3. No acute fracture. Upper chest: There is an incompletely visualized left apical pneumothorax. There is a layering right hemothorax. Pneumomediastinum. Extensive gas throughout the cervical soft tissues. Other: None IMPRESSION: 1. There is a moderate left apical pneumothorax. Left chest tube is visualizedon scout. 2. Layering right hemothorax, incompletely visualized. 3. Pneumomediastinum. 4. Extensive gas throughout the visualized cervical soft tissues. 5. No acute intracranial process. 6. No acute cervical spine fracture.  7. Degenerative disc disease. Electronically Signed   By: Annia Belt  M.D.   On: 02/12/2019 13:09   Ct Chest W Contrast  Result Date: 02/12/2019 CLINICAL DATA:  Blunt abdominal trauma. EXAM: CT CHEST, ABDOMEN, AND PELVIS WITH CONTRAST TECHNIQUE: Multidetector CT imaging of the chest, abdomen and pelvis was performed following the standard protocol during bolus administration of intravenous contrast. CONTRAST:  ISOVUE-300 IOPAMIDOL (ISOVUE-300) INJECTION 61% COMPARISON:  None. FINDINGS: CT CHEST FINDINGS Cardiovascular: No significant vascular findings. Normal heart size. No pericardial effusion. Mediastinum/Nodes: Extensive pneumomediastinum is noted which extends into the cervical soft tissues, bilateral supraclavicular area and along the left chest wall. Thyroid gland is unremarkable. No definite adenopathy is noted. Lungs/Pleura: Large right pleural effusion is noted. Emphysematous disease is noted. Pigtail catheter is noted anteriorly in the left upper lobe with minimal left apical pneumothorax. Mild left posterior basilar atelectasis is noted. Musculoskeletal: As noted above, extensive subcutaneous emphysema is seen involving the left lateral chest wall mildly displaced fractures are seen involving the left sixth, seventh, eighth, ninth and tenth ribs posteriorly. CT ABDOMEN PELVIS FINDINGS Hepatobiliary: No focal liver abnormality is seen. No gallstones, gallbladder wall thickening, or biliary dilatation. Pancreas: Unremarkable. No pancreatic ductal dilatation or surrounding inflammatory changes. Spleen: Normal in size without focal abnormality. Adrenals/Urinary Tract: Adrenal glands are unremarkable. Kidneys are normal, without renal calculi, focal lesion, or hydronephrosis. Bladder is unremarkable. Stomach/Bowel: Stomach is within normal limits. Appendix appears normal. No evidence of bowel wall thickening, distention, or inflammatory changes. Vascular/Lymphatic: No significant vascular findings are present. No enlarged abdominal or pelvic lymph nodes. Reproductive:  Prostate is unremarkable. Other: Extensive subcutaneous emphysema is seen involving both lateral abdominal walls extending into the lower anterior abdominal and pelvic wall and scrotum. Musculoskeletal: No acute or significant osseous findings. IMPRESSION: Extensive pneumomediastinum is noted concerning for possible esophageal, tracheal or bronchial injury. This is seen to extend into the cervical soft tissues, as well as the bilateral supraclavicular areas and along the left lateral chest wall. It is also noted to extend into both lateral abdominal walls and into the lower abdominal and pelvic wall and scrotum. Pigtail left-sided chest tube is noted with minimal left apical pneumothorax. Large right pleural effusion is noted. Mild left posterior basilar subsegmental atelectasis is noted. Mildly displaced left sixth, seventh, eighth, ninth and tenth rib fractures are noted. Emphysema (ICD10-J43.9). Electronically Signed   By: Lupita Raider M.D.   On: 02/12/2019 14:46   Ct Cervical Spine Wo Contrast  Result Date: 02/12/2019 CLINICAL DATA:  Patient status post fall from a ladder. EXAM: CT HEAD WITHOUT CONTRAST CT CERVICAL SPINE WITHOUT CONTRAST TECHNIQUE: Multidetector CT imaging of the head and cervical spine was performed following the standard protocol without intravenous contrast. Multiplanar CT image reconstructions of the cervical spine were also generated. COMPARISON:  None. FINDINGS: CT HEAD FINDINGS Brain: Ventricles and sulci are appropriate for patient's age. No evidence for acute cortically based infarct, intracranial hemorrhage, mass lesion or mass-effect. Vascular: Unremarkable Skull: Intact Sinuses/Orbits: Right maxillary sinus mucosal thickening. Mastoid air cells are unremarkable. Orbits are unremarkable. Other: Extensive gas within the soft tissues about the skull base. CT CERVICAL SPINE FINDINGS Alignment: Normal anatomic alignment. Skull base and vertebrae: No acute fracture. No primary bone  lesion or focal pathologic process. Soft tissues and spinal canal: No prevertebral fluid or swelling. No visible canal hematoma. Disc levels: Degenerative disc disease most pronounced C5-6 and C6-7. Left-sided facet degenerative changes C2-3. No acute fracture. Upper chest: There is an  incompletely visualized left apical pneumothorax. There is a layering right hemothorax. Pneumomediastinum. Extensive gas throughout the cervical soft tissues. Other: None IMPRESSION: 1. There is a moderate left apical pneumothorax. Left chest tube is visualizedon scout. 2. Layering right hemothorax, incompletely visualized. 3. Pneumomediastinum. 4. Extensive gas throughout the visualized cervical soft tissues. 5. No acute intracranial process. 6. No acute cervical spine fracture. 7. Degenerative disc disease. Electronically Signed   By: Annia Beltrew  Davis M.D.   On: 02/12/2019 13:09   Ct Abdomen Pelvis W Contrast  Result Date: 02/12/2019 CLINICAL DATA:  Blunt abdominal trauma. EXAM: CT CHEST, ABDOMEN, AND PELVIS WITH CONTRAST TECHNIQUE: Multidetector CT imaging of the chest, abdomen and pelvis was performed following the standard protocol during bolus administration of intravenous contrast. CONTRAST:  100mL ISOVUE-300 IOPAMIDOL (ISOVUE-300) INJECTION 61% COMPARISON:  None. FINDINGS: CT CHEST FINDINGS Cardiovascular: No significant vascular findings. Normal heart size. No pericardial effusion. Mediastinum/Nodes: Extensive pneumomediastinum is noted which extends into the cervical soft tissues, bilateral supraclavicular area and along the left chest wall. Thyroid gland is unremarkable. No definite adenopathy is noted. Lungs/Pleura: Large right pleural effusion is noted. Emphysematous disease is noted. Pigtail catheter is noted anteriorly in the left upper lobe with minimal left apical pneumothorax. Mild left posterior basilar atelectasis is noted. Musculoskeletal: As noted above, extensive subcutaneous emphysema is seen involving the left  lateral chest wall mildly displaced fractures are seen involving the left sixth, seventh, eighth, ninth and tenth ribs posteriorly. CT ABDOMEN PELVIS FINDINGS Hepatobiliary: No focal liver abnormality is seen. No gallstones, gallbladder wall thickening, or biliary dilatation. Pancreas: Unremarkable. No pancreatic ductal dilatation or surrounding inflammatory changes. Spleen: Normal in size without focal abnormality. Adrenals/Urinary Tract: Adrenal glands are unremarkable. Kidneys are normal, without renal calculi, focal lesion, or hydronephrosis. Bladder is unremarkable. Stomach/Bowel: Stomach is within normal limits. Appendix appears normal. No evidence of bowel wall thickening, distention, or inflammatory changes. Vascular/Lymphatic: No significant vascular findings are present. No enlarged abdominal or pelvic lymph nodes. Reproductive: Prostate is unremarkable. Other: Extensive subcutaneous emphysema is seen involving both lateral abdominal walls extending into the lower anterior abdominal and pelvic wall and scrotum. Musculoskeletal: No acute or significant osseous findings. IMPRESSION: Extensive pneumomediastinum is noted concerning for possible esophageal, tracheal or bronchial injury. This is seen to extend into the cervical soft tissues, as well as the bilateral supraclavicular areas and along the left lateral chest wall. It is also noted to extend into both lateral abdominal walls and into the lower abdominal and pelvic wall and scrotum. Pigtail left-sided chest tube is noted with minimal left apical pneumothorax. Large right pleural effusion is noted. Mild left posterior basilar subsegmental atelectasis is noted. Mildly displaced left sixth, seventh, eighth, ninth and tenth rib fractures are noted. Emphysema (ICD10-J43.9). Electronically Signed   By: Lupita RaiderJames  Green Jr M.D.   On: 02/12/2019 14:46   Dg Chest Port 1 View  Result Date: 02/14/2019 CLINICAL DATA:  Left pneumothorax. EXAM: PORTABLE CHEST 1 VIEW  COMPARISON:  Chest x-ray from yesterday. FINDINGS: Unchanged left apical chest tube. Unchanged trace medial and apical left pneumothorax. Unchanged pneumomediastinum. Unchanged trace right pleural effusion. Persistent medial bibasilar atelectasis, slightly increased on the left. Normal heart size. Normal pulmonary vascularity. Unchanged left-sided rib fractures. Unchanged extensive subcutaneous emphysema in the left greater than right lateral chest wall and bilateral lower neck. IMPRESSION: 1. Unchanged pneumomediastinum and trace left apical pneumothorax. Electronically Signed   By: Obie DredgeWilliam T Derry M.D.   On: 02/14/2019 09:35   [facility]  Result  Date: 02/13/2019 CLINICAL DATA:  Chest tube in place. EXAM: PORTABLE CHEST 1 VIEW COMPARISON:  Radiograph February 12, 2019. FINDINGS: Stable cardiomediastinal silhouette. Stable left-sided chest tube is noted. Minimal left apical pneumothorax is noted. Stable extensive subcutaneous emphysema is seen over the left lateral chest wall and both supraclavicular regions. Mild right pleural effusion is noted which is decreased compared to prior exam, with associated atelectasis. Stable left lower rib fractures are noted. IMPRESSION: Stable left-sided chest tube is noted. Minimal left apical pneumothorax is noted. Stable subcutaneous emphysema is noted as described above. Slightly decreased right pleural effusion is noted with associated atelectasis. Electronically Signed   By: Lupita RaiderJames  Green Jr M.D.   On: 02/13/2019 08:55   Dg Chest Portable 1 View  Result Date: 02/12/2019 CLINICAL DATA:  Status post left chest tube insertion. EXAM: PORTABLE CHEST 1 VIEW COMPARISON:  February 12, 2019 11:02 a.m. FINDINGS: The heart size and mediastinal contours are stable. There is interval insertion of left chest tube with significant interval decrease of previously noted left pneumothorax. Evaluation of the left lung is limited due to extensive subcutaneous emphysema over left chest and  bilateral neck. Right pleural effusion is unchanged. The visualized skeletal structures are stable. IMPRESSION: Interval insertion of left chest tube with significant interval decrease of previously noted left pneumothorax. Evaluation of the left lung is limited due to extensive subcutaneous emphysema of left chest and bilateral neck. Electronically Signed   By: Sherian ReinWei-Chen  Lin M.D.   On: 02/12/2019 12:11   Dg Chest Portable 1 View  Result Date: 02/12/2019 CLINICAL DATA:  Patient status post fall from ladder. EXAM: PORTABLE CHEST 1 VIEW COMPARISON:  None. FINDINGS: Monitoring leads overlie the patient. Normal cardiac contours. Moderate left pneumothorax. Moderate right pleural effusion. Trace left pleural effusion. Consolidation throughout the lungs bilaterally. Pneumomediastinum. Extensive subcutaneous emphysema overlying the neck and left chest wall soft tissues. Thoracic spine degenerative changes. Displaced left posterior and lateral lower rib fractures. IMPRESSION: 1. Moderate to large left pneumothorax. Subsequent chest radiograph demonstrates a left chest tube in place. 2. Findings suggestive of pneumomediastinum. 3. Moderate right pleural effusion.  Trace left pleural effusion. 4. Consolidative opacities throughout the lungs bilaterally which may represent contusion or edema. 5. Extensive subcutaneous emphysema overlying the lower neck and left chest wall. 6. Multiple left-sided rib fractures. Electronically Signed   By: Annia Beltrew  Davis M.D.   On: 02/12/2019 12:03   Dg Esophagus W Single Cm (sol Or Thin Ba)  Result Date: 02/12/2019 CLINICAL DATA:  Chest trauma EXAM: ESOPHOGRAM/BARIUM SWALLOW TECHNIQUE: Single contrast examination was performed using water-soluble contrast; Omnipaque 300. FLUOROSCOPY TIME:  Fluoroscopy Time:  30 seconds Radiation Exposure Index (if provided by the fluoroscopic device): Not applicable. Number of Acquired Spot Images: 0 COMPARISON:  CT of earlier today FINDINGS: Focused,  single-contrast exam performed with patient in mild LPO and RPO positioning. Preprocedure scout film demonstrates a left-sided chest tube in place with pneumomediastinum. Full column evaluation of the esophagus demonstrates no contrast extravasation to suggest perforation. No obstruction to contrast passage. No pleural contrast at the end of the exam. IMPRESSION: No evidence of esophageal perforation. Electronically Signed   By: Jeronimo GreavesKyle  Talbot M.D.   On: 02/12/2019 17:08   [place] Thoracentesis Asp Pleural Space W/img Guide  Result Date: 02/13/2019 INDICATION: Patient with right pleural effusion s/p fall from a ladder. Request is made for diagnostic and therapeutic thoracentesis. EXAM: ULTRASOUND GUIDED DIAGNOSTIC AND THERAPEUTIC RIGHT THORACENTESIS MEDICATIONS: 10 mL 1% lidocaine COMPLICATIONS: None immediate. PROCEDURE: An ultrasound  guided thoracentesis was thoroughly discussed with the patient and questions answered. The benefits, risks, alternatives and complications were also discussed. The patient understands and wishes to proceed with the procedure. Written consent was obtained. Ultrasound was performed to localize and mark an adequate pocket of fluid in the right chest. The area was then prepped and draped in the normal sterile fashion. 1% Lidocaine was used for local anesthesia. Under ultrasound guidance a 6 Fr Safe-T-Centesis catheter was introduced. Thoracentesis was performed. The catheter was removed and a dressing applied. FINDINGS: A total of approximately 1.0 liters of bloody fluid was removed. Samples were sent to the laboratory as requested by the clinical team. IMPRESSION: Successful ultrasound guided diagnostic and therapeutic right thoracentesis yielding 1.0 of pleural fluid. Read by: Loyce DysKacie Matthews PA-C Electronically Signed   By: Judie PetitM.  Shick M.D.   On: 02/13/2019 14:10    Anti-infectives: Anti-infectives (From admission, onward)   None      Assessment/Plan: Fall from ladder Left  hemopneumothorax - cont CT on suction today. Xray still with small left apical PTX Pneumomediastinum/Significant subcutaneous emphysema - likely secondary to PTX, esophagram negative for leak.  Will advance po Left 6-9rib fractures, mildly displaced. - pain control, pulm toilet, IS, mobilize Right pleural effusion - chest xray looks clear Emphysematous change in lungs/tobacco abuse - pulm toilet, IS Superficial abrasions to back.  Mild hyperkalemia- resolved Acute renal insufficiency - Cr now normal. FEN - IVFs, NPO until after thora VTE - Lovenox ID - none  LOS: 2 days    Abigail Miyamotoouglas Luvena Wentling 02/14/2019

## 2019-02-15 ENCOUNTER — Inpatient Hospital Stay (HOSPITAL_COMMUNITY): Payer: Managed Care, Other (non HMO)

## 2019-02-15 NOTE — Discharge Instructions (Signed)
Coping with Quitting Smoking ° °Quitting smoking is a physical and mental challenge. You will face cravings, withdrawal symptoms, and temptation. Before quitting, work with your health care provider to make a plan that can help you cope. Preparation can help you quit and keep you from giving in. °How can I cope with cravings? °Cravings usually last for 5-10 minutes. If you get through it, the craving will pass. Consider taking the following actions to help you cope with cravings: °· Keep your mouth busy: °? Chew sugar-free gum. °? Suck on hard candies or a straw. °? Brush your teeth. °· Keep your hands and body busy: °? Immediately change to a different activity when you feel a craving. °? Squeeze or play with a ball. °? Do an activity or a hobby, like making bead jewelry, practicing needlepoint, or working with wood. °? Mix up your normal routine. °? Take a short exercise break. Go for a quick walk or run up and down stairs. °? Spend time in public places where smoking is not allowed. °· Focus on doing something kind or helpful for someone else. °· Call a friend or family member to talk during a craving. °· Join a support group. °· Call a quit line, such as 1-800-QUIT-NOW. °· Talk with your health care provider about medicines that might help you cope with cravings and make quitting easier for you. °How can I deal with withdrawal symptoms? °Your body may experience negative effects as it tries to get used to not having nicotine in the system. These effects are called withdrawal symptoms. They may include: °· Feeling hungrier than normal. °· Trouble concentrating. °· Irritability. °· Trouble sleeping. °· Feeling depressed. °· Restlessness and agitation. °· Craving a cigarette. °To manage withdrawal symptoms: °· Avoid places, people, and activities that trigger your cravings. °· Remember why you want to quit. °· Get plenty of sleep. °· Avoid coffee and other caffeinated drinks. These may worsen some of your  symptoms. °How can I handle social situations? °Social situations can be difficult when you are quitting smoking, especially in the first few weeks. To manage this, you can: °· Avoid parties, bars, and other social situations where people might be smoking. °· Avoid alcohol. °· Leave right away if you have the urge to smoke. °· Explain to your family and friends that you are quitting smoking. Ask for understanding and support. °· Plan activities with friends or family where smoking is not an option. °What are some ways I can cope with stress? °Wanting to smoke may cause stress, and stress can make you want to smoke. Find ways to manage your stress. Relaxation techniques can help. For example: °· Breathe slowly and deeply, in through your nose and out through your mouth. °· Listen to soothing, relaxing music. °· Talk with a family member or friend about your stress. °· Light a candle. °· Soak in a bath or take a shower. °· Think about a peaceful place. °What are some ways I can prevent weight gain? °Be aware that many people gain weight after they quit smoking. However, not everyone does. To keep from gaining weight, have a plan in place before you quit and stick to the plan after you quit. Your plan should include: °· Having healthy snacks. When you have a craving, it may help to: °? Eat plain popcorn, crunchy carrots, celery, or other cut vegetables. °? Chew sugar-free gum. °· Changing how you eat: °? Eat small portion sizes at meals. °? Eat 4-6 small meals   throughout the day instead of 1-2 large meals a day. ? Be mindful when you eat. Do not watch television or do other things that might distract you as you eat.  Exercising regularly: ? Make time to exercise each day. If you do not have time for a long workout, do short bouts of exercise for 5-10 minutes several times a day. ? Do some form of strengthening exercise, like weight lifting, and some form of aerobic exercise, like running or swimming.  Drinking  plenty of water or other low-calorie or no-calorie drinks. Drink 6-8 glasses of water daily, or as much as instructed by your health care provider. Summary  Quitting smoking is a physical and mental challenge. You will face cravings, withdrawal symptoms, and temptation to smoke again. Preparation can help you as you go through these challenges.  You can cope with cravings by keeping your mouth busy (such as by chewing gum), keeping your body and hands busy, and making calls to family, friends, or a helpline for people who want to quit smoking.  You can cope with withdrawal symptoms by avoiding places where people smoke, avoiding drinks with caffeine, and getting plenty of rest.  Ask your health care provider about the different ways to prevent weight gain, avoid stress, and handle social situations. This information is not intended to replace advice given to you by your health care provider. Make sure you discuss any questions you have with your health care provider. Document Released: 07/26/2016 Document Revised: 07/11/2017 Document Reviewed: 07/26/2016 Elsevier Patient Education  2020 [employer]   Pleural Effusion Pleural effusion is an abnormal buildup of fluid in the layers of tissue between the lungs and the inside of the chest (pleural space) The two layers of tissue that line the lungs and the inside of the chest are called pleura. Usually, there is no air in the space between the pleura, only a thin layer of fluid. Some conditions can cause a large amount of fluid to build up, which can cause the lung to collapse if untreated. A pleural effusion is usually caused by another disease that requires treatment. What are the causes? Pleural effusion can be caused by:  Heart failure.  Certain infections, such as pneumonia or tuberculosis.  Cancer.  A blood clot in the lung (pulmonary embolism).  Complications from surgery, such as from open heart surgery.  Liver disease  (cirrhosis).  Kidney disease. What are the signs or symptoms? In some cases, pleural effusion may cause no symptoms. If symptoms are present, they may include:  Shortness of breath, especially when lying down.  Chest pain. This may get worse when taking a deep breath.  Fever.  Dry, long-lasting (chronic) cough.  Hiccups.  Rapid breathing. An underlying condition that is causing the pleural effusion (such as heart failure, pneumonia, blood clots, tuberculosis, or cancer) may also cause other symptoms. How is this diagnosed? This condition may be diagnosed based on:  Your symptoms and medical history.  A physical exam.  A chest X-ray.  A procedure to use a needle to remove fluid from the pleural space (thoracentesis). This fluid is tested.  Other imaging studies of the chest, such as ultrasound or CT scan. How is this treated? Depending on the cause of your condition, treatment may include:  Treating the underlying condition that is causing the effusion. When that condition improves, the effusion will also improve. Examples of treatment for underlying conditions include: ? Antibiotic medicines to treat an infection. ? Diuretics or other heart  medicines to treat heart failure.  Thoracentesis.  Placing a thin flexible tube under your skin and into your chest to continuously drain the effusion (indwelling pleural catheter).  Surgery to remove the outer layer of tissue from the pleural space (decortication).  A procedure to put medicine into the chest cavity to seal the pleural space and prevent fluid buildup (pleurodesis).  Chemotherapy and radiation therapy, if you have cancerous (malignant) pleural effusion. These treatments are typically used to treat cancer. They kill certain cells in the body. Follow these instructions at home:  Take over-the-counter and prescription medicines only as told by your health care provider.  Ask your health care provider what activities  are safe for you.  Keep track of how long you are able to do mild exercise (such as walking) before you get short of breath. Write down this information to share with your health care provider. Your ability to exercise should improve over time.  Do not use any products that contain nicotine or tobacco, such as cigarettes and e-cigarettes. If you need help quitting, ask your health care provider.  Keep all follow-up visits as told by your health care provider. This is important. Contact a health care provider if:  The amount of time that you are able to do mild exercise: ? Decreases. ? Does not improve with time.  You have a fever. Get help right away if:  You are short of breath.  You develop chest pain.  You develop a new cough. Summary  Pleural effusion is an abnormal buildup of fluid in the layers of tissue between the lungs and the inside of the chest.  Pleural effusion can have many causes, including heart failure, pulmonary embolism, infections, or cancer.  Symptoms of pleural effusion can include shortness of breath, chest pain, fever, long-lasting (chronic) cough, hiccups, or rapid breathing.  Diagnosis often involves making images of the chest (such as with ultrasound or X-ray) and removing fluid (thoracentesis) to send for testing.  Treatment for pleural effusion depends on what underlying condition is causing it. This information is not intended to replace advice given to you by your health care provider. Make sure you discuss any questions you have with your health care provider. Document Released: 07/29/2005 Document Revised: 07/11/2017 Document Reviewed: 04/03/2017 Elsevier Patient Education  2020 [employer]   Rib Fracture  A rib fracture is a break or crack in one of the bones of the ribs. The ribs are like a cage that goes around your upper chest. A broken or cracked rib is often painful, but most do not cause other problems. Most rib fractures usually heal  on their own in 1-3 months. Follow these instructions at home: Managing pain, stiffness, and swelling  If directed, apply ice to the injured area. ? Put ice in a plastic bag. ? Place a towel between your skin and the bag. ? Leave the ice on for 20 minutes, 2-3 times a day.  Take over-the-counter and prescription medicines only as told by your doctor. Activity  Avoid activities that cause pain to the injured area. Protect your injured area.  Slowly increase activity as told by your doctor. General instructions  Do deep breathing as told by your doctor. You may be told to: ? Take deep breaths many times a day. ? Cough many times a day while hugging a pillow. ? Use a device (incentive spirometer) to do deep breathing many times a day.  Drink enough fluid to keep your pee (urine) clear or  pale yellow.  Do not wear a rib belt or binder. These do not allow you to breathe deeply.  Keep all follow-up visits as told by your doctor. This is important. Contact a doctor if:  You have a fever. Get help right away if:  You have trouble breathing.  You are short of breath.  You cannot stop coughing.  You cough up thick or bloody spit (sputum).  You feel sick to your stomach (nauseous), throw up (vomit), or have belly (abdominal) pain.  Your pain gets worse and medicine does not help. Summary  A rib fracture is a break or crack in one of the bones of the ribs.  Apply ice to the injured area and take medicines for pain as told by your doctor.  Take deep breaths and cough many times a day. Hug a pillow every time you cough. This information is not intended to replace advice given to you by your health care provider. Make sure you discuss any questions you have with your health care provider. Document Released: 05/07/2008 Document Revised: 07/11/2017 Document Reviewed: 10/29/2016 Elsevier Patient Education  2020 [employer]   Pneumothorax A pneumothorax is commonly called a  collapsed lung. It is a condition in which air leaks from a lung and builds up between the thin layer of tissue that covers the lungs (visceral pleura) and the interior wall of the chest cavity (parietal pleura). The air gets trapped outside the lung, between the lung and the chest wall (pleural space). The air takes up space and prevents the lung from fully expanding. This condition sometimes occurs suddenly with no apparent cause. The buildup of air may be small or large. A small pneumothorax may go away on its own. A large pneumothorax will require treatment and hospitalization. What are the causes? This condition may be caused by:  Trauma and injury to the chest wall.  Surgery and other medical procedures.  A complication of an underlying lung problem, especially chronic obstructive pulmonary disease (COPD) or emphysema. Sometimes the cause of this condition is not known. What increases the risk? You are more likely to develop this condition if:  You have an underlying lung problem.  You smoke.  You are 4220-54 years old, male, tall, and underweight.  You have a personal or family history of pneumothorax.  You have an eating disorder (anorexia nervosa). This condition can also happen quickly, even in people with no history of lung problems. What are the signs or symptoms? Sometimes a pneumothorax will have no symptoms. When symptoms are present, they can include:  Chest pain.  Shortness of breath.  Increased rate of breathing.  Bluish color to your lips or skin (cyanosis). How is this diagnosed? This condition may be diagnosed by:  A medical history and physical exam.  A chest X-ray, chest CT scan, or ultrasound. How is this treated? Treatment depends on how severe your condition is. The goal of treatment is to remove the extra air and allow your lung to expand back to its normal size.  For a small pneumothorax: ? No treatment may be needed. ? Extra oxygen is sometimes  used to make it go away more quickly.  For a large pneumothorax or a pneumothorax that is causing symptoms, a procedure is done to drain the air from your lungs. To do this, a health care provider may use: ? A needle with a syringe. This is used to suck air from a pleural space where no additional leakage is taking place. ?  A chest tube. This is used to suck air where there is ongoing leakage into the pleural space. The chest tube may need to remain in place for several days until the air leak has healed.  In more severe cases, surgery may be needed to repair the damage that is causing the leak.  If you have multiple pneumothorax episodes or have an air leak that will not heal, a procedure called a pleurodesis may be done. A medicine is placed in the pleural space to irritate the tissues around the lung so that the lung will stick to the chest wall, seal any leaks, and stop any buildup of air in that space. If you have an underlying lung problem, severe symptoms, or a large pneumothorax you will usually need to stay in the hospital. Follow these instructions at home: Lifestyle  Do not use any products that contain nicotine or tobacco, such as cigarettes and e-cigarettes. These are major risk factors in pneumothorax. If you need help quitting, ask your health care provider.  Do not lift anything that is heavier than 10 lb (4.5 kg), or the limit that your health care provider tells you, until he or she says that it is safe.  Avoid activities that take a lot of effort (strenuous) for as long as told by your health care provider.  Return to your normal activities as told by your health care provider. Ask your health care provider what activities are safe for you.  Do not fly in an airplane or scuba dive until your health care provider says it is okay. General instructions  Take over-the-counter and prescription medicines only as told by your health care provider.  If a cough or pain makes it  difficult for you to sleep at night, try sleeping in a semi-upright position in a recliner or by using 2 or 3 pillows.  If you had a chest tube and it was removed, ask your health care provider when you can remove the bandage (dressing). While the dressing is in place, do not allow it to get wet.  Keep all follow-up visits as told by your health care provider. This is important. Contact a health care provider if:  You cough up thick mucus (sputum) that is yellow or green in color.  You were treated with a chest tube, and you have redness, increasing pain, or discharge at the site where it was placed. Get help right away if:  You have increasing chest pain or shortness of breath.  You have a cough that will not go away.  You begin coughing up blood.  You have pain that is getting worse or is not controlled with medicines.  The site where your chest tube was located opens up.  You feel air coming out of the site where the chest tube was placed.  You have a fever or persistent symptoms for more than 2-3 days.  You have a fever and your symptoms suddenly get worse. These symptoms may represent a serious problem that is an emergency. Do not wait to see if the symptoms will go away. Get medical help right away. Call your local emergency services (911 in the [place] Do not drive yourself to the hospital. Summary  A pneumothorax, commonly called a collapsed lung, is a condition in which air leaks from a lung and gets trapped between the lung and the chest wall (pleural space).  The buildup of air may be small or large. A small pneumothorax may go away on its  own. A large pneumothorax will require treatment and hospitalization.  Treatment for this condition depends on how severe the pneumothorax is. The goal of treatment is to remove the extra air and allow the lung to expand back to its normal size. This information is not intended to replace advice given to you by your health care  provider. Make sure you discuss any questions you have with your health care provider. Document Released: 07/29/2005 Document Revised: 07/11/2017 Document Reviewed: 07/07/2017 Elsevier Patient Education  2020 [employer].

## 2019-02-15 NOTE — Progress Notes (Signed)
Patient ID: Jake Roach, male   DOB: 08/23/1965, 53 y.o.   MRN: [id number]       Subjective: Doing well.  Pain still present but controlled.  Breathing much better after thora.  Ambulated yesterday well.  Eating well.    Objective: Vital signs in last 24 hours: Temp:  [98 F (36.7 C)-98.9 F (37.2 C)] 98.7 F (37.1 C) (07/06 0626) Pulse Rate:  [72-85] 72 (07/06 0626) Resp:  [19-24] 20 (07/06 0626) BP: (134-143)/(72-81) 134/73 (07/06 0626) SpO2:  [94 %-96 %] 95 % (07/06 0626) Last BM Date: 02/14/19  Intake/Output from previous day: 07/05 0701 - 07/06 0700 In: 1568.5 [I.V.:1568.5] Out: 625 [Urine:525; Chest Tube:100] Intake/Output this shift: No intake/output data recorded.  PE: Gen: NAD Heart: regular Lungs: still with rhonchi mostly on the left, unclear if some of this is still noise heard from crepitus that is still present as well.  CT in place with no airleak.  100cc of output.  Right side is CTA Abd: soft, NT, ND, +BS  Lab Results:  Recent Labs    02/13/19 0155 02/14/19 0329  WBC 18.2* 10.7*  HGB 15.6 12.5*  HCT 45.3 35.7*  PLT 245 183   BMET Recent Labs    02/13/19 0155 02/14/19 0329  NA 136 133*  K 4.9 3.9  CL 104 104  CO2 22 22  GLUCOSE 144* 116*  BUN 29* 18  CREATININE 1.38* 0.80  CALCIUM 8.8* 8.0*   PT/INR Recent Labs    02/12/19 1226  LABPROT 12.8  INR 1.0   CMP     Component Value Date/Time   NA 133 (L) 02/14/2019 0329   K 3.9 02/14/2019 0329   CL 104 02/14/2019 0329   CO2 22 02/14/2019 0329   GLUCOSE 116 (H) 02/14/2019 0329   BUN 18 02/14/2019 0329   CREATININE 0.80 02/14/2019 0329   CALCIUM 8.0 (L) 02/14/2019 0329   PROT 6.9 02/12/2019 1226   ALBUMIN 3.8 02/12/2019 1226   AST 39 02/12/2019 1226   ALT 22 02/12/2019 1226   ALKPHOS 79 02/12/2019 1226   BILITOT 1.2 02/12/2019 1226   GFRNONAA >60 02/14/2019 0329   GFRAA >60 02/14/2019 0329   Lipase  No results found for: LIPASE     Studies/Results: Dg Chest 1  View  Result Date: 02/13/2019 CLINICAL DATA:  Status post right thoracentesis EXAM: CHEST  1 VIEW COMPARISON:  Chest radiograph from earlier today. FINDINGS: Stable apical left chest tube position. Stable cardiomediastinal silhouette with normal heart size. No right pneumothorax. Tiny medial/apical left pneumothorax, decreased. Trace residual right pleural effusion, decreased. No left pleural effusion. No pulmonary edema. Patchy medial bibasilar lung opacities, slightly decreased on the right. Retained oral contrast in the visualized colon. Subcutaneous emphysema throughout the left chest wall and bilateral lower neck, stable. IMPRESSION: 1. Trace residual right pleural effusion, decreased. No right pneumothorax. 2. Tiny medial apical left pneumothorax, decreased. Left apical chest tube in place. 3. Patchy medial bibasilar lung opacities, slightly decreased on the right. Electronically Signed   By: Delbert PhenixJason A Poff M.D.   On: 02/13/2019 13:09   Dg Chest Port 1 View  Result Date: 02/15/2019 CLINICAL DATA:  Fall from ladder.  Traumatic pneumothorax. EXAM: PORTABLE CHEST 1 VIEW COMPARISON:  One-view chest x-ray 02/14/2019 FINDINGS: Left-sided chest tube is in place. No significant residual pneumothorax is present. A small amount of mediastinal air remains. Extensive subcutaneous emphysema with air dissecting into both sides the neck is again noted. Bibasilar atelectasis is improving. Overall lung  volumes are improving. IMPRESSION: 1. Improving lung volumes and basilar atelectasis. 2. No significant residual left-sided pneumothorax. 3. Pneumomediastinum. 4. Similar appearance of extensive subcutaneous emphysema. Electronically Signed   By: San Morelle M.D.   On: 02/15/2019 06:18   Dg Chest Port 1 View  Result Date: 02/14/2019 CLINICAL DATA:  Left pneumothorax. EXAM: PORTABLE CHEST 1 VIEW COMPARISON:  Chest x-ray from yesterday. FINDINGS: Unchanged left apical chest tube. Unchanged trace medial and apical  left pneumothorax. Unchanged pneumomediastinum. Unchanged trace right pleural effusion. Persistent medial bibasilar atelectasis, slightly increased on the left. Normal heart size. Normal pulmonary vascularity. Unchanged left-sided rib fractures. Unchanged extensive subcutaneous emphysema in the left greater than right lateral chest wall and bilateral lower neck. IMPRESSION: 1. Unchanged pneumomediastinum and trace left apical pneumothorax. Electronically Signed   By: Titus Dubin M.D.   On: 02/14/2019 09:35   US Thoracentesis Asp Pleural Space W/img Guide  Result Date: 02/13/2019 INDICATION: Patient with right pleural effusion s/p fall from a ladder. Request is made for diagnostic and therapeutic thoracentesis. EXAM: ULTRASOUND GUIDED DIAGNOSTIC AND THERAPEUTIC RIGHT THORACENTESIS MEDICATIONS: 10 mL 1% lidocaine COMPLICATIONS: None immediate. PROCEDURE: An ultrasound guided thoracentesis was thoroughly discussed with the patient and questions answered. The benefits, risks, alternatives and complications were also discussed. The patient understands and wishes to proceed with the procedure. Written consent was obtained. Ultrasound was performed to localize and mark an adequate pocket of fluid in the right chest. The area was then prepped and draped in the normal sterile fashion. 1% Lidocaine was used for local anesthesia. Under ultrasound guidance a 6 Fr Safe-T-Centesis catheter was introduced. Thoracentesis was performed. The catheter was removed and a dressing applied. FINDINGS: A total of approximately 1.0 liters of bloody fluid was removed. Samples were sent to the laboratory as requested by the clinical team. IMPRESSION: Successful ultrasound guided diagnostic and therapeutic right thoracentesis yielding 1.0 of pleural fluid. Read by: Brynda Greathouse PA-C Electronically Signed   By: Jerilynn Mages.  Shick M.D.   On: 02/13/2019 14:10    Anti-infectives: Anti-infectives (From admission, onward)   None        Assessment/Plan Fall from ladder Left hemopneumothorax- PTX resolved.  waterseal CT today, repeat film in am Pneumomediastinum/Significant subcutaneous emphysema- likely secondary to PTX, esophagram negative for leak. 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  Mild hyperkalemia- resolved Acute renal insufficiency- Cr now normal. FEN -saline lock IV, regular diet VTE -Lovenox ID -none   LOS: 3 days    Henreitta Cea , [facility] 02/15/2019, 8:02 AM Pager: [phone]

## 2019-02-15 NOTE — Progress Notes (Signed)
Pt chest tube disconnected from suction. Water checked for appropriate levels. System checked for air leaks. No issues. Will continue to monitor pt and tube system. Jerald Kief, RN

## 2019-02-15 NOTE — Progress Notes (Signed)
Physical Therapy Treatment Patient Details Name: Jake Roach MRN: [id number] DOB: 09-23-1965 Today's Date: 02/15/2019    History of Present Illness Pt is a 53 y.o. M with no significant PMH who presents after a fall off of a ladder with left hemopneumothorax, pneumomediastinum/subcutaneous emphysema, left 6-9 rib fractures, and right pleural effusion s/p thoracentesis 7/4.    PT Comments    Progressing well with only small decreases in SpO2 with gait.    Follow Up Recommendations  No PT follow up     Equipment Recommendations  None recommended by PT    Recommendations for Other Services       Precautions / Restrictions Precautions Precaution Comments: chest tube    Mobility  Bed Mobility Overal bed mobility: Independent                Transfers Overall transfer level: Independent                  Ambulation/Gait Ambulation/Gait assistance: Supervision Gait Distance (Feet): 300 Feet Assistive device: None Gait Pattern/deviations: WFL(Within Functional Limits)   Gait velocity interpretation: >2.62 ft/sec, indicative of community ambulatory General Gait Details: scanned, stepped over obstacles, backed up and increased speed without deviation   Stairs Stairs: Yes Stairs assistance: Independent Stair Management: No rails;Alternating pattern Number of Stairs: 3 General stair comments: safe in general   Wheelchair Mobility    Modified Rankin (Stroke Patients Only)       Balance Overall balance assessment: No apparent balance deficits (not formally assessed)                                          Cognition Arousal/Alertness: Awake/alert Behavior During Therapy: WFL for tasks assessed/performed Overall Cognitive Status: Within Functional Limits for tasks assessed                                        Exercises      General Comments General comments (skin integrity, edema, etc.): sats on RA dipped  into the 80's during gait, but quickly returned at rest.      Pertinent Vitals/Pain Pain Assessment: Faces Faces Pain Scale: Hurts a little bit Pain Location: ribs Pain Descriptors / Indicators: Guarding Pain Intervention(s): Monitored during session    Home Living                      Prior Function            PT Goals (current goals can now be found in the care plan section) Acute Rehab PT Goals Patient Stated Goal: "be up." PT Goal Formulation: With patient Time For Goal Achievement: 02/27/19 Potential to Achieve Goals: Good Progress towards PT goals: Progressing toward goals    Frequency    Min 3X/week      PT Plan Current plan remains appropriate    Co-evaluation              AM-PAC PT "6 Clicks" Mobility   Outcome Measure  Help needed turning from your back to your side while in a flat bed without using bedrails?: None Help needed moving from lying on your back to sitting on the side of a flat bed without using bedrails?: None Help needed moving to and from a bed to a chair (including a  wheelchair)?: None Help needed standing up from a chair using your arms (e.g., wheelchair or bedside chair)?: None Help needed to walk in hospital room?: None Help needed climbing 3-5 steps with a railing? : None 6 Click Score: 24    End of Session   Activity Tolerance: Patient tolerated treatment well Patient left: with call bell/phone within reach;in bed;with bed alarm set Nurse Communication: Mobility status PT Visit Diagnosis: Pain Pain - part of body: (rib and flank)     Time: [id number] PT Time Calculation (min) (ACUTE ONLY): 16 min  Charges:  $Gait Training: 8-22 mins                     02/15/2019  [place] BingKen Jassiah Viviano, PT Acute Rehabilitation Services [phone]  (pager) [phone]  (office)   Eliseo GumKenneth V Markcus Lazenby 02/15/2019, 5:04 PM

## 2019-02-16 ENCOUNTER — Encounter (HOSPITAL_COMMUNITY): Payer: Self-pay | Admitting: General Practice

## 2019-02-16 ENCOUNTER — Inpatient Hospital Stay (HOSPITAL_COMMUNITY): Payer: Managed Care, Other (non HMO)

## 2019-02-16 ENCOUNTER — Other Ambulatory Visit: Payer: Self-pay

## 2019-02-16 HISTORY — PX: OTHER SURGICAL HISTORY: SHX169

## 2019-02-16 LAB — BPAM RBC
Blood Product Expiration Date: 202007152359
Blood Product Expiration Date: 202007152359
ISSUE DATE / TIME: 202007031109
ISSUE DATE / TIME: 202007031109
Unit Type and Rh: 5100
Unit Type and Rh: 5100

## 2019-02-16 LAB — TYPE AND SCREEN
ABO/RH(D): O POS
Antibody Screen: NEGATIVE
Unit division: 0
Unit division: 0

## 2019-02-16 MED ORDER — ACETAMINOPHEN 500 MG PO TABS: 1000.0000 mg | ORAL_TABLET | Freq: Four times a day (QID) | ORAL | 0 refills | Status: AC

## 2019-02-16 MED ORDER — METHOCARBAMOL 750 MG PO TABS: 750.0000 mg | ORAL_TABLET | Freq: Three times a day (TID) | ORAL | 0 refills | Status: AC

## 2019-02-16 MED ORDER — OXYCODONE HCL 5 MG PO TABS: 5.0000 mg | ORAL_TABLET | ORAL | 0 refills | Status: AC | PRN

## 2019-02-16 NOTE — Progress Notes (Addendum)
Pt has subcutaneous emphysema on left ateria- lateral chest, abdominal  and partial left side of his neck. Chest tube is under water sealed, system has no air leak, no significant drainage since day shift RN reported today = 0 ml. Old drainage in container marked at 400 ml. Auscultate left breath sound has coarse crackle and crepitation with murmur heart sound, no productive cough. Pt stated he has heart murmur since he was young. His vital signs remain stable, no acute distress noted at this time. Pain controlled well, Pt tolerated well with no complaint. Continue to monitor.  Kennyth Lose, RN

## 2019-02-16 NOTE — Progress Notes (Signed)
Order received to discharge patient.  PIV access removed.  Discharge instructions, follow up, medications and instructions for their use discussed with patient. 

## 2019-02-16 NOTE — Discharge Summary (Addendum)
     Patient ID: Jake Roach [id number] 27-Sep-1965 52 y.o.  Admit date: 02/12/2019 Discharge date: 02/16/2019  Admitting Diagnosis: Fall from ladder Pneumothorax of left with left rib fractures 6-9 Pneumomediastinum  Right pleural effusion  Discharge Diagnosis Patient Active Problem List   Diagnosis Date Noted  . Hemopneumothorax on left 02/12/2019  Fall from ladder Pneumothorax of left with left rib fractures 6-9 Pneumomediastinum  Right pleural effusion Acute renal insufficiency secondary to dehydration, resolved   Consultants none  Reason for Admission: Pt came to ED as level 1 trauma.  Pt fell 10 feet from ladder yesterday.  He complains of shortness of breath.  He hit the ground.  He also got an abrasion on his back.  He denies loss of consciousness.  He denies hitting his abdomen.  He denies n/v.  He has not had any more recent falls.  He is a Administrator.  He denied any alcohol or drug use associated with the fall.   Procedures IR thoracentesis of right pleural effusion Placement of left chest tube  Hospital Course:  The patient was admitted for management of his left rib fractures and PTX.  He has significant pneumomediastinum and the right pleural effusion.  An esophagram was ordered to rule out esophageal injury.  This was negative.  He was started on a diet and tolerated this well.  He underwent a thoracentesis of his right pleural effusion which was present on admission and note felt to be related to his traumatic event.  1L was removed and this was bloody in characteristic.  His cytology was negative and it was negative for infection.  No obvious identifiable cause was noted at this time.  He will need to obtain a PCP to follow up this up.  His chest remained in place for several days.  The PTX resolved and his CT was removed.  Follow up CXR was stable and the patient was felt stable for DC home.  Physical Exam: See note from earlier today  Allergies as of  02/16/2019   No Known Allergies     Medication List    TAKE these medications   acetaminophen 500 MG tablet Commonly known as: TYLENOL Take 2 tablets (1,000 mg total) by mouth every 6 (six) hours.   methocarbamol 750 MG tablet Commonly known as: ROBAXIN Take 1 tablet (750 mg total) by mouth 3 (three) times daily.   oxyCODONE 5 MG immediate release tablet Commonly known as: Oxy IR/ROXICODONE Take 1-2 tablets (5-10 mg total) by mouth every 4 (four) hours as needed for moderate pain.        Follow-up Information    [facility] Follow up on 03/02/2019.   Why: 9:20am, arrive 30 minute prior to appointment for paperwork and check in.  Please bring insurance card and photo ID Contact information: [place] [id number] [place] Follow up in 2 week(s).   Why: go day prior to your trauma clinic visit to have follow up chest x-ray completed Contact information: [place] [address] [phone]        primary care doctor Follow up.   Why: follow up for pleural effusion          Signed: Saverio Danker, [facility] 02/16/2019, 1:52 PM Pager: [phone]

## 2019-02-16 NOTE — Progress Notes (Signed)
Patient ID: Jake Roach, male   DOB: 1965-10-16, 54 y.o.   MRN: [id number]       Subjective: Patient with no complaints.  Still has "smoker's cough"  Otherwise no complaints.    Objective: Vital signs in last 24 hours: Temp:  [97.9 F (36.6 C)-98.8 F (37.1 C)] 97.9 F (36.6 C) (07/07 0523) Pulse Rate:  [64-82] 82 (07/07 0523) Resp:  [15-20] 17 (07/07 0523) BP: (136-147)/(78-83) 138/81 (07/07 0523) SpO2:  [93 %-98 %] 93 % (07/07 0523) Last BM Date: 02/16/19  Intake/Output from previous day: 07/06 0701 - 07/07 0700 In: 2740 [P.O.:730; I.V.:2010] Out: 630 [Urine:500; Chest Tube:130] Intake/Output this shift: No intake/output data recorded.  PE: Gen: NAd Heart: regular Lungs: overall CTA on right side.  Still with some coarse BS on left  Left chest tube with 130cc of serosang output.  CT removed with no issues.  Large ecchymosis on left flank Abd: soft, NT, ND   Lab Results:  Recent Labs    02/14/19 0329  WBC 10.7*  HGB 12.5*  HCT 35.7*  PLT 183   BMET Recent Labs    02/14/19 0329  NA 133*  K 3.9  CL 104  CO2 22  GLUCOSE 116*  BUN 18  CREATININE 0.80  CALCIUM 8.0*   PT/INR No results for input(s): LABPROT, INR in the last 72 hours. CMP     Component Value Date/Time   NA 133 (L) 02/14/2019 0329   K 3.9 02/14/2019 0329   CL 104 02/14/2019 0329   CO2 22 02/14/2019 0329   GLUCOSE 116 (H) 02/14/2019 0329   BUN 18 02/14/2019 0329   CREATININE 0.80 02/14/2019 0329   CALCIUM 8.0 (L) 02/14/2019 0329   PROT 6.9 02/12/2019 1226   ALBUMIN 3.8 02/12/2019 1226   AST 39 02/12/2019 1226   ALT 22 02/12/2019 1226   ALKPHOS 79 02/12/2019 1226   BILITOT 1.2 02/12/2019 1226   GFRNONAA >60 02/14/2019 0329   GFRAA >60 02/14/2019 0329   Lipase  No results found for: LIPASE     Studies/Results: Dg Chest Port 1 View  Result Date: 02/16/2019 CLINICAL DATA:  Left pneumothorax. EXAM: PORTABLE CHEST 1 VIEW COMPARISON:  02/15/2019 and 02/14/2019 FINDINGS: Left  chest tube in place, unchanged. No appreciable pneumothorax. Small bilateral effusions. Slight atelectasis at the left lung base, unchanged. Multiple displaced left rib fractures. Subcutaneous emphysema has diminished. Heart size and vascularity are normal. Pneumomediastinum is slightly less prominent. IMPRESSION: 1. No pneumothorax. 2. Slight decrease in pneumomediastinum. 3. Persistent small bilateral effusions and left base atelectasis. Electronically Signed   By: Lorriane Shire M.D.   On: 02/16/2019 08:17   Dg Chest Port 1 View  Result Date: 02/15/2019 CLINICAL DATA:  Fall from ladder.  Traumatic pneumothorax. EXAM: PORTABLE CHEST 1 VIEW COMPARISON:  One-view chest x-ray 02/14/2019 FINDINGS: Left-sided chest tube is in place. No significant residual pneumothorax is present. A small amount of mediastinal air remains. Extensive subcutaneous emphysema with air dissecting into both sides the neck is again noted. Bibasilar atelectasis is improving. Overall lung volumes are improving. IMPRESSION: 1. Improving lung volumes and basilar atelectasis. 2. No significant residual left-sided pneumothorax. 3. Pneumomediastinum. 4. Similar appearance of extensive subcutaneous emphysema. Electronically Signed   By: San Morelle M.D.   On: 02/15/2019 06:18    Anti-infectives: Anti-infectives (From admission, onward)   None       Assessment/Plan Fall from ladder Left hemopneumothorax- PTX resolved.  CT pulled today.  Output at 130cc.  Will repeat  CXR.  If effusion increases at all will likely keep an extra day and follow Pneumomediastinum/Significant subcutaneous emphysema- likely secondary to PTX, esophagram negative for leak. Tolerating regular diet Left 6-9rib fractures, mildly displaced.- pain control, pulm toilet, IS, mobilize Right pleural effusion-s/p thora, bloody, cyto pending, not infected Emphysematous change in lungs/tobacco abuse- pulm toilet, IS Superficial abrasions to back.   Mild hyperkalemia- resolved Acute renal insufficiency-Cr now normal. FEN -saline lock IV, regular diet VTE -Lovenox ID -none   LOS: 4 days    Letha CapeKelly E Akiya Morr , [facility] 02/16/2019, 9:03 AM Pager: [phone]

## 2019-02-16 NOTE — Progress Notes (Signed)
Pt noted to have significant drainage from chest tube site, saturating dressing and bed linens.  PA called and requests RN add additional dressing on top of original dressing.  Will continue to monitor.

## 2019-02-18 LAB — CULTURE, BODY FLUID W GRAM STAIN -BOTTLE: Culture: NO GROWTH

## 2019-03-01 ENCOUNTER — Ambulatory Visit
Admission: RE | Admit: 2019-03-01 | Discharge: 2019-03-01 | Disposition: A | Discharge status: Home / Self Care | Payer: Managed Care, Other (non HMO) | Source: Ambulatory Visit | Attending: General Surgery | Admitting: General Surgery

## 2019-03-01 ENCOUNTER — Other Ambulatory Visit: Payer: Self-pay | Admitting: General Surgery

## 2019-03-01 ENCOUNTER — Other Ambulatory Visit: Payer: Self-pay

## 2019-03-01 DIAGNOSIS — J939 Pneumothorax, unspecified: Secondary | ICD-10-CM

## 2020-07-07 IMAGING — RF ESOPHAGUS/BARIUM SWALLOW/TABLET STUDY
6 of 7 series · 12 of 14 positions shown · IV contrast (omnipaque)
Comparison: CT of earlier today

CLINICAL DATA: Chest trauma

EXAM:
ESOPHOGRAM/BARIUM SWALLOW
TECHNIQUE: Single contrast examination was performed using water-soluble
contrast; Omnipaque 300.
FLUOROSCOPY TIME:  Fluoroscopy Time:  30 seconds
Radiation Exposure Index (if provided by the fluoroscopic device):
Not applicable.
Number of Acquired Spot Images: 0

[Series 1: cp_standard · 0.25mm/px · 1 of 1 slices shown (1 of 6)]
[im 1/1]
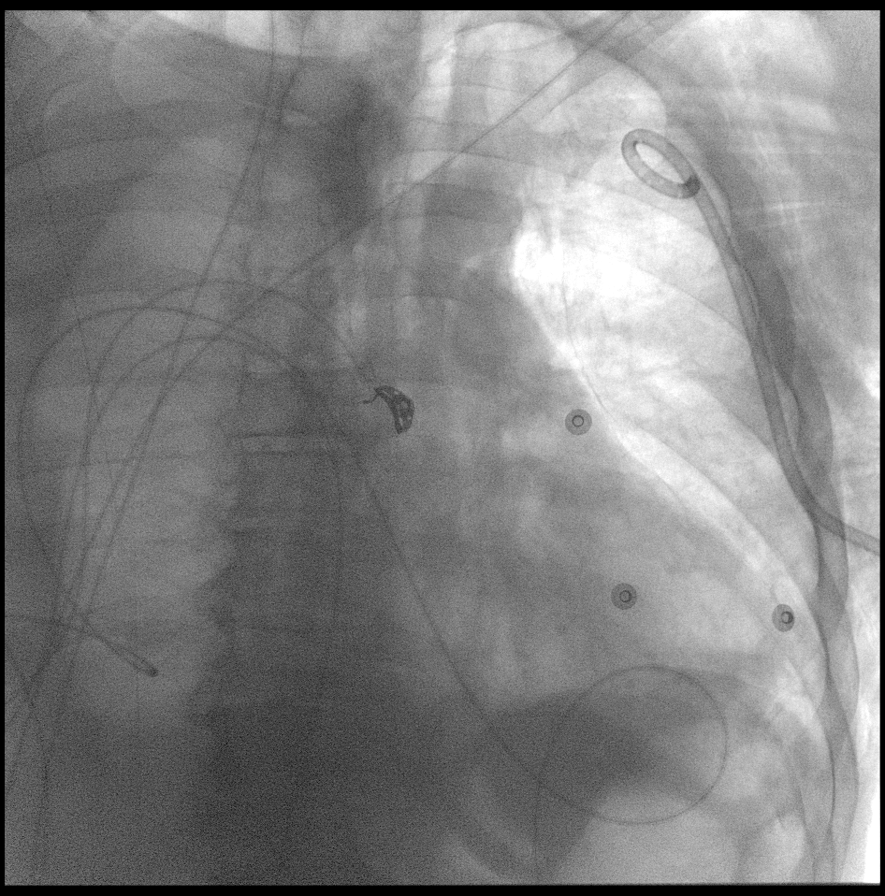

[Series 3: cp_standard · 0.25mm/px · 2 of 2 slices shown (2 of 6)]
[im 1/2]
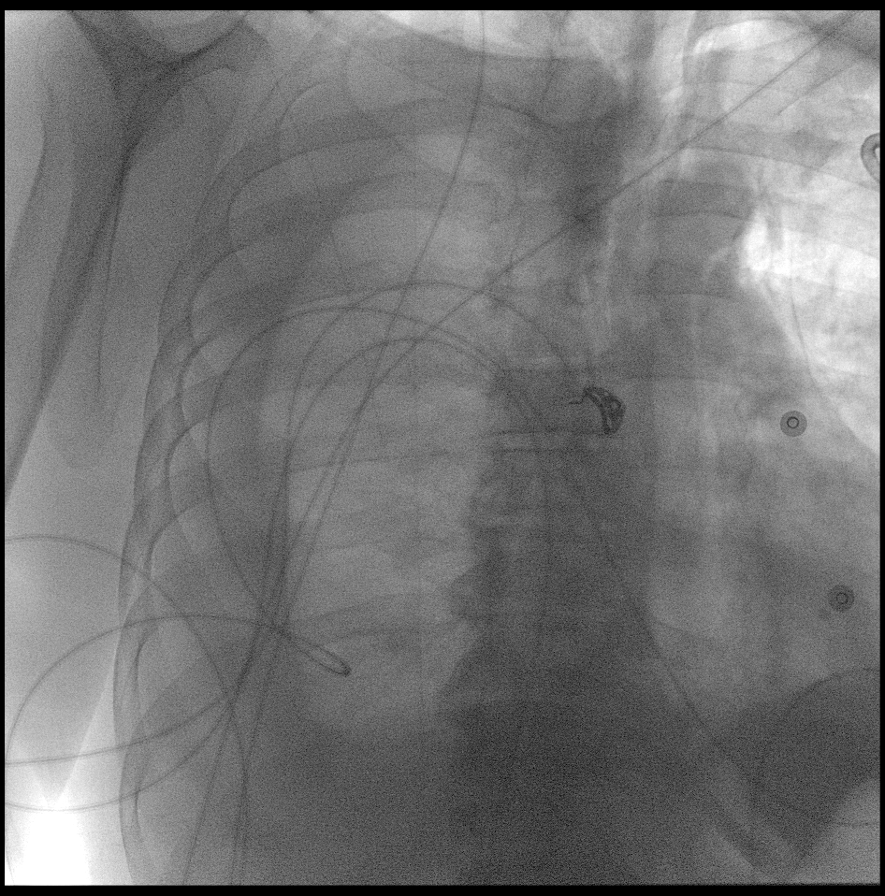
[im 2/2]
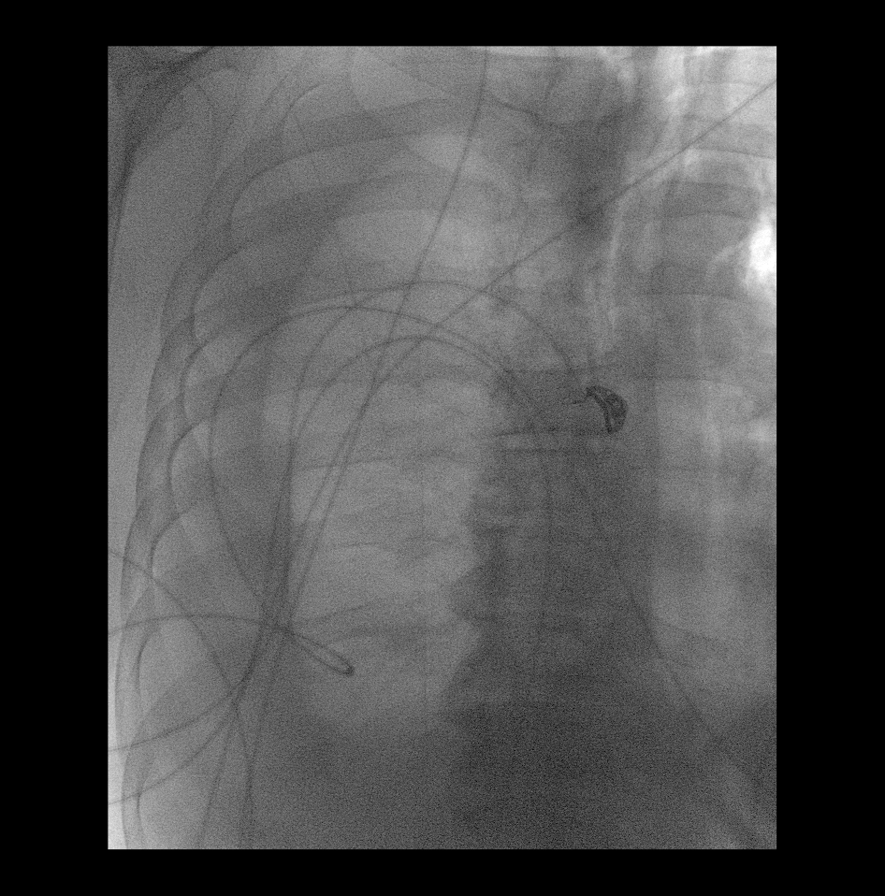

[Series 5: cp_standard · 0.51mm/px · 4 of 231 frames shown (3 of 6)]
[frame 15/231]
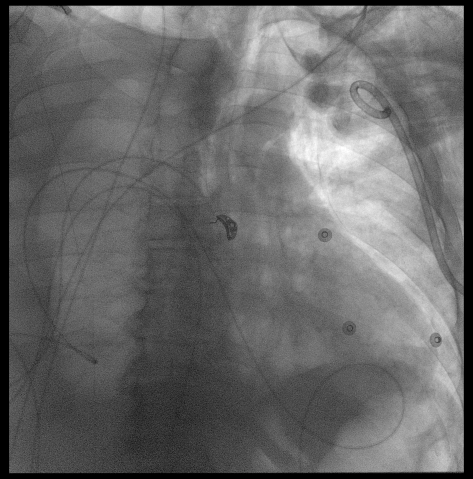
[frame 35/231]
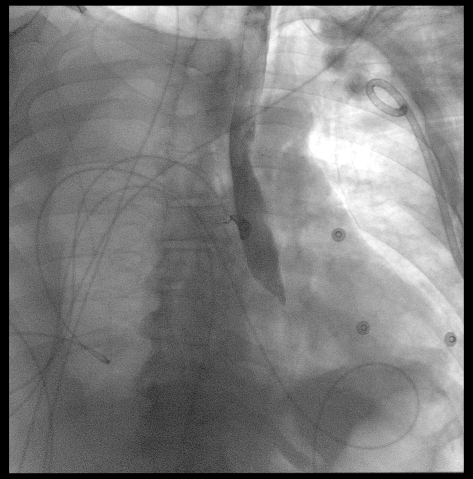
[frame 116/231]
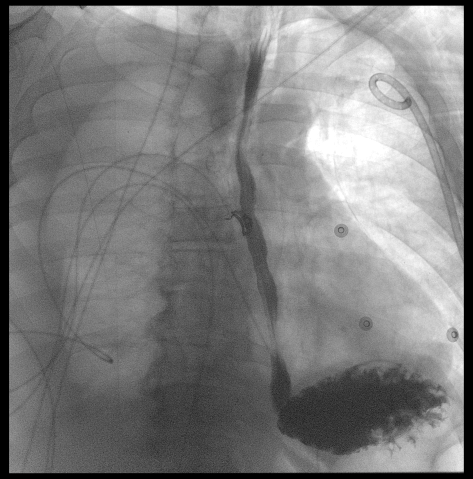
[frame 197/231]
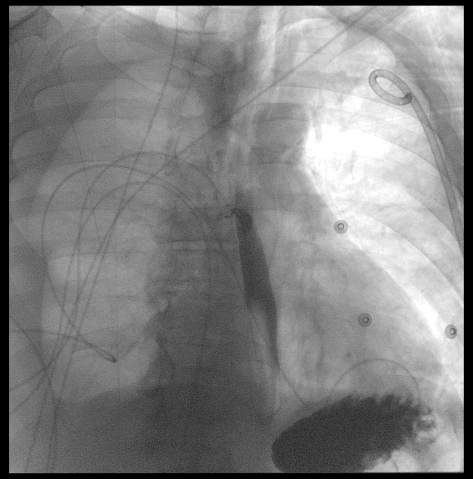

[Series 6: cp_standard · 0.51mm/px · 3 of 145 frames shown (4 of 6)]
[frame 6/145]
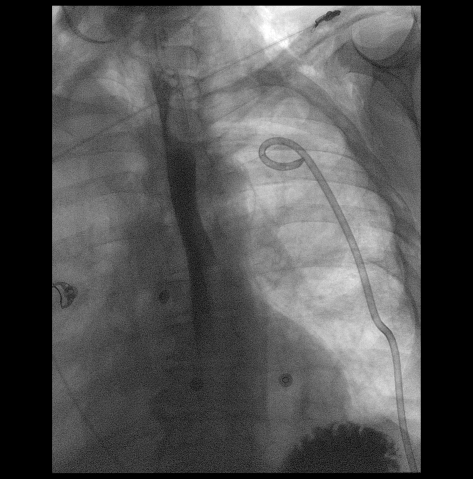
[frame 22/145]
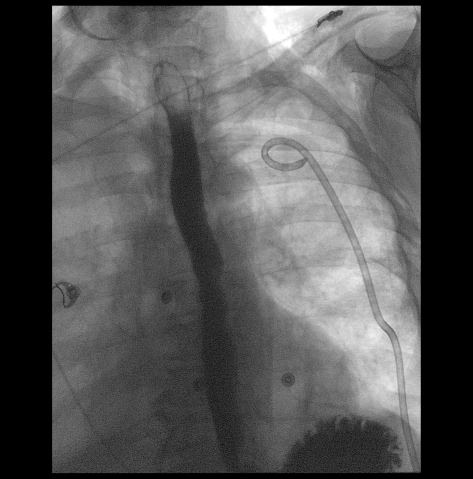
[frame 124/145]
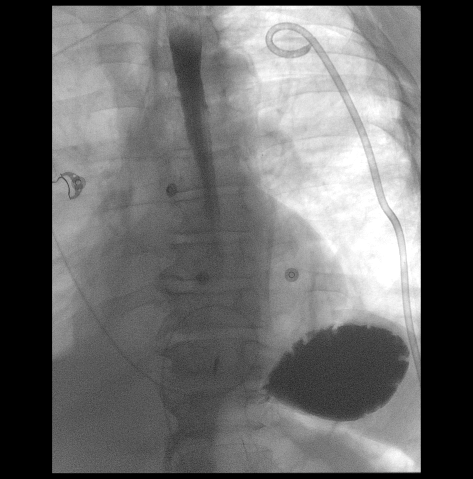

[Series 7: cp_standard · 0.25mm/px · 1 of 1 slices shown (5 of 6)]
[im 1/1]
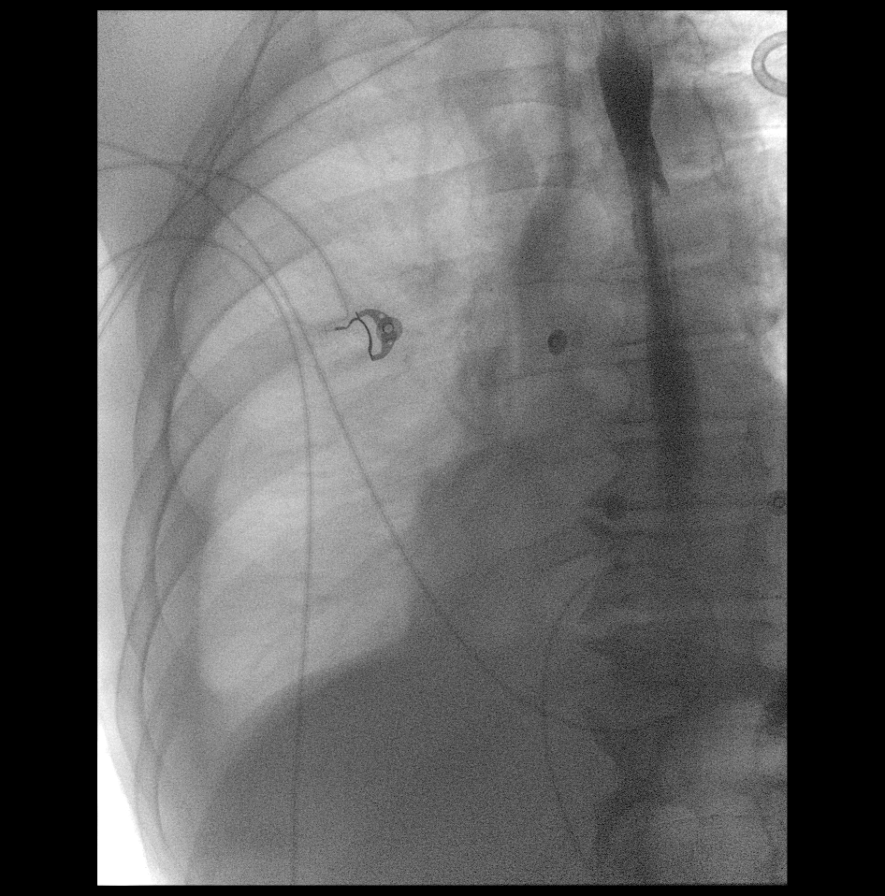

[Series 8: cp_standard · 0.25mm/px · 1 of 1 slices shown (6 of 6)]
[im 1/1]
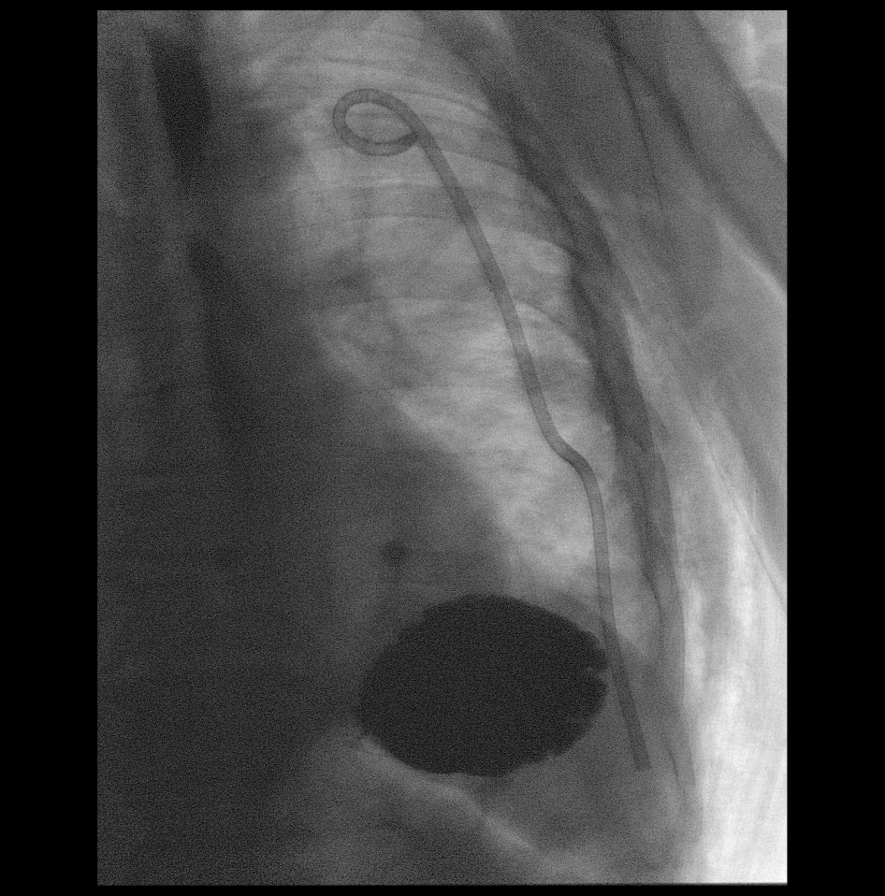

[12 of 14 positions shown; findings below may reference images not displayed]

FINDINGS: Focused, single-contrast exam performed with patient in mild LPO and
RPO positioning. Preprocedure scout film demonstrates a left-sided
chest tube in place with pneumomediastinum.

Full column evaluation of the esophagus demonstrates no contrast
extravasation to suggest perforation. No obstruction to contrast
passage.

No pleural contrast at the end of the exam.
IMPRESSION: No evidence of esophageal perforation.

## 2020-07-07 IMAGING — CT CT CHEST WITH CONTRAST
2 of 6 series · 14 of 46 positions shown, 16 images · IV contrast (iopamidol)
Comparison: None.

CLINICAL DATA: Blunt abdominal trauma.

EXAM:
CT CHEST, ABDOMEN, AND PELVIS WITH CONTRAST
TECHNIQUE: Multidetector CT imaging of the chest, abdomen and pelvis was
performed following the standard protocol during bolus
administration of intravenous contrast.
CONTRAST:  100mL 3VDJ5O-YRR IOPAMIDOL (3VDJ5O-YRR) INJECTION 61%

[Series 3: cap with · axial · 0.79mm/px · z∈[+761,+1311]mm · 11 of 134 slices shown, 13 images]
[im 12/134  soft-tissue]
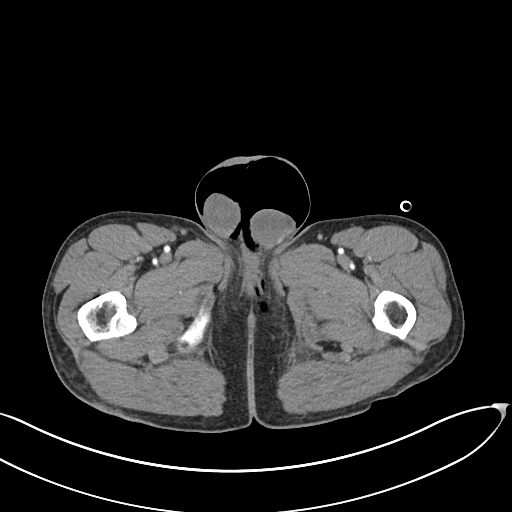
[im 12/134  bone]
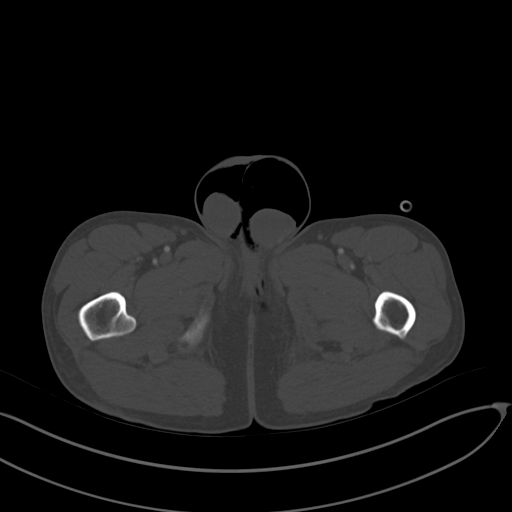
[im 23/134  soft-tissue]
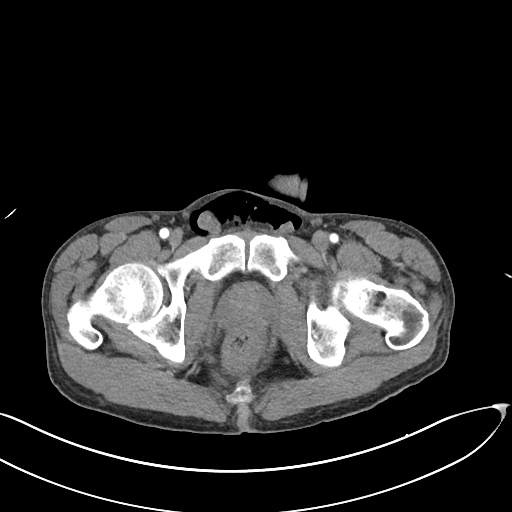
[im 34/134  soft-tissue]
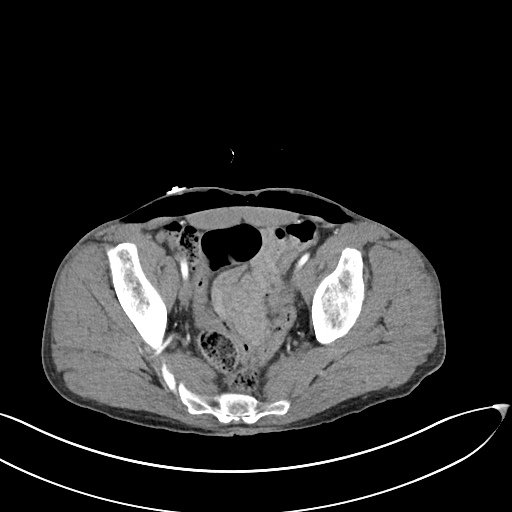
[im 45/134  soft-tissue]
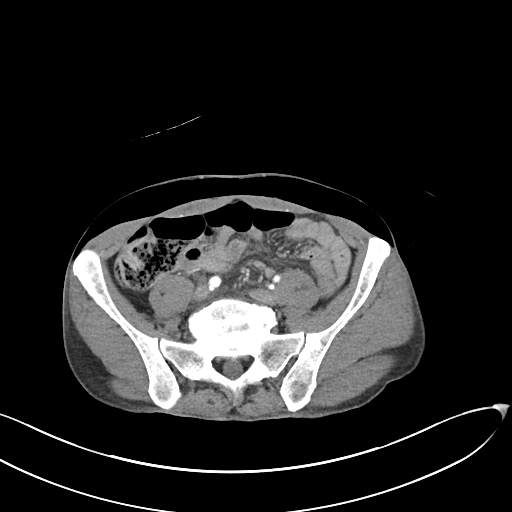
[im 56/134  soft-tissue]
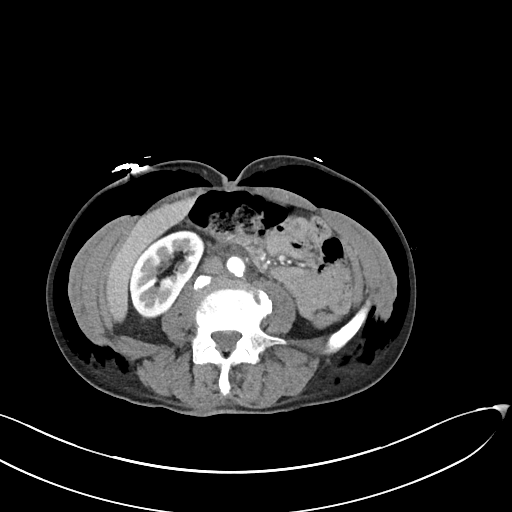
[im 67/134  soft-tissue]
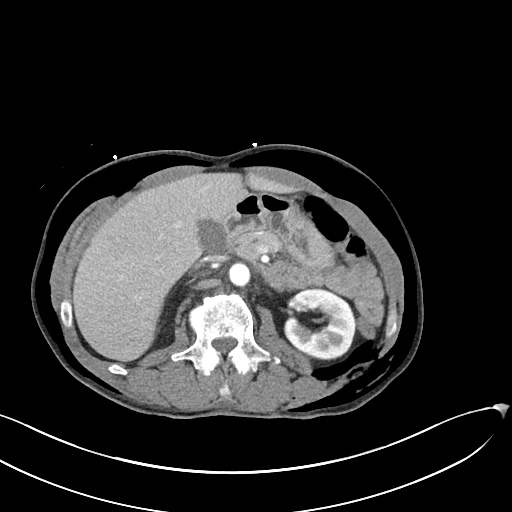
[im 78/134  soft-tissue]
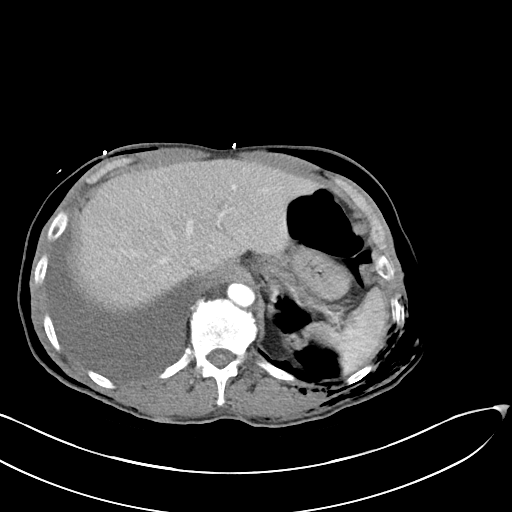
[im 89/134  soft-tissue]
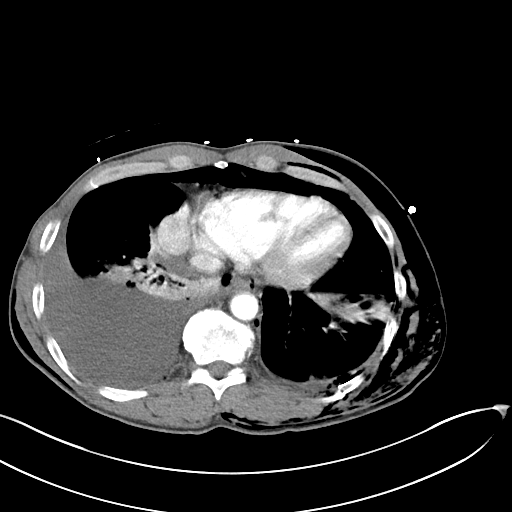
[im 100/134  soft-tissue]
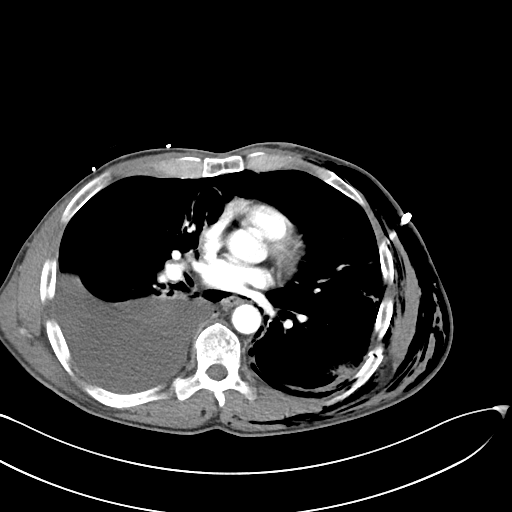
[im 100/134  bone]
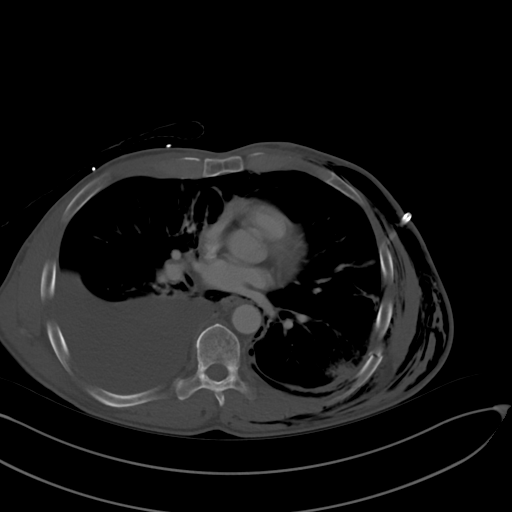
[im 111/134  soft-tissue]
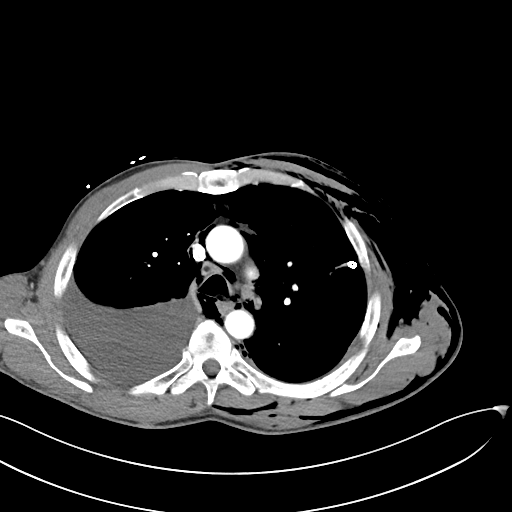
[im 122/134  soft-tissue]
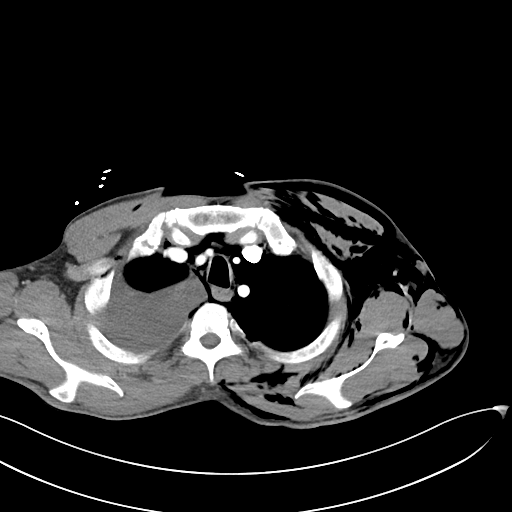

[Series 6: cor · coronal · 0.96mm/px · 3 of 96 slices shown]
[im 32/96  soft-tissue]
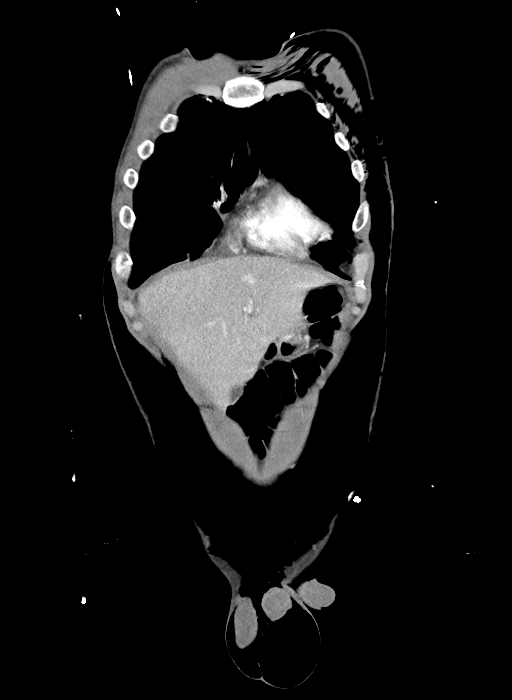
[im 43/96  soft-tissue]
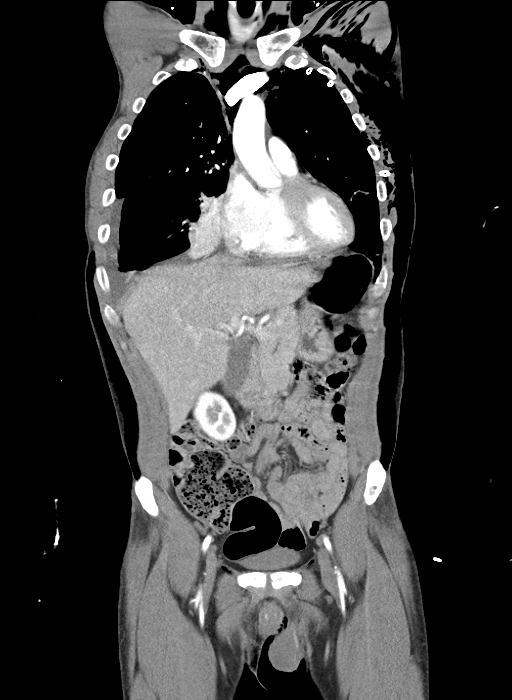
[im 53/96  soft-tissue]
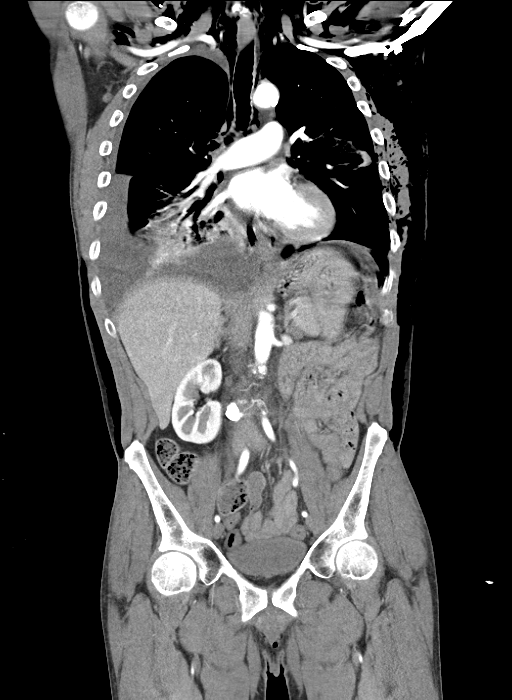

[14 of 46 positions shown; findings below may reference images not displayed]

FINDINGS: CT CHEST FINDINGS

Cardiovascular: No significant vascular findings. Normal heart size.
No pericardial effusion.

Mediastinum/Nodes: Extensive pneumomediastinum is noted which
extends into the cervical soft tissues, bilateral supraclavicular
area and along the left chest wall. Thyroid gland is unremarkable.
No definite adenopathy is noted.

Lungs/Pleura: Large right pleural effusion is noted. Emphysematous
disease is noted. Pigtail catheter is noted anteriorly in the left
upper lobe with minimal left apical pneumothorax. Mild left
posterior basilar atelectasis is noted.

Musculoskeletal: As noted above, extensive subcutaneous emphysema is
seen involving the left lateral chest wall mildly displaced
fractures are seen involving the left sixth, seventh, eighth, ninth
and tenth ribs posteriorly.

CT ABDOMEN PELVIS FINDINGS

Hepatobiliary: No focal liver abnormality is seen. No gallstones,
gallbladder wall thickening, or biliary dilatation.

Pancreas: Unremarkable. No pancreatic ductal dilatation or
surrounding inflammatory changes.

Spleen: Normal in size without focal abnormality.

Adrenals/Urinary Tract: Adrenal glands are unremarkable. Kidneys are
normal, without renal calculi, focal lesion, or hydronephrosis.
Bladder is unremarkable.

Stomach/Bowel: Stomach is within normal limits. Appendix appears
normal. No evidence of bowel wall thickening, distention, or
inflammatory changes.

Vascular/Lymphatic: No significant vascular findings are present. No
enlarged abdominal or pelvic lymph nodes.

Reproductive: Prostate is unremarkable.

Other: Extensive subcutaneous emphysema is seen involving both
lateral abdominal walls extending into the lower anterior abdominal
and pelvic wall and scrotum.

Musculoskeletal: No acute or significant osseous findings.
IMPRESSION: Extensive pneumomediastinum is noted concerning for possible
esophageal, tracheal or bronchial injury. This is seen to extend
into the cervical soft tissues, as well as the bilateral
supraclavicular areas and along the left lateral chest wall. It is
also noted to extend into both lateral abdominal walls and into the
lower abdominal and pelvic wall and scrotum.

Pigtail left-sided chest tube is noted with minimal left apical
pneumothorax.

Large right pleural effusion is noted. Mild left posterior basilar
subsegmental atelectasis is noted.

Mildly displaced left sixth, seventh, eighth, ninth and tenth rib
fractures are noted.

Emphysema ([id number]).

## 2020-07-11 IMAGING — DX PORTABLE CHEST - 1 VIEW
1 series · 1 of 1 positions shown · non-contrast
Comparison: 02/15/2019 and 02/14/2019

CLINICAL DATA: Left pneumothorax.

EXAM:
PORTABLE CHEST 1 VIEW

[chest ap]
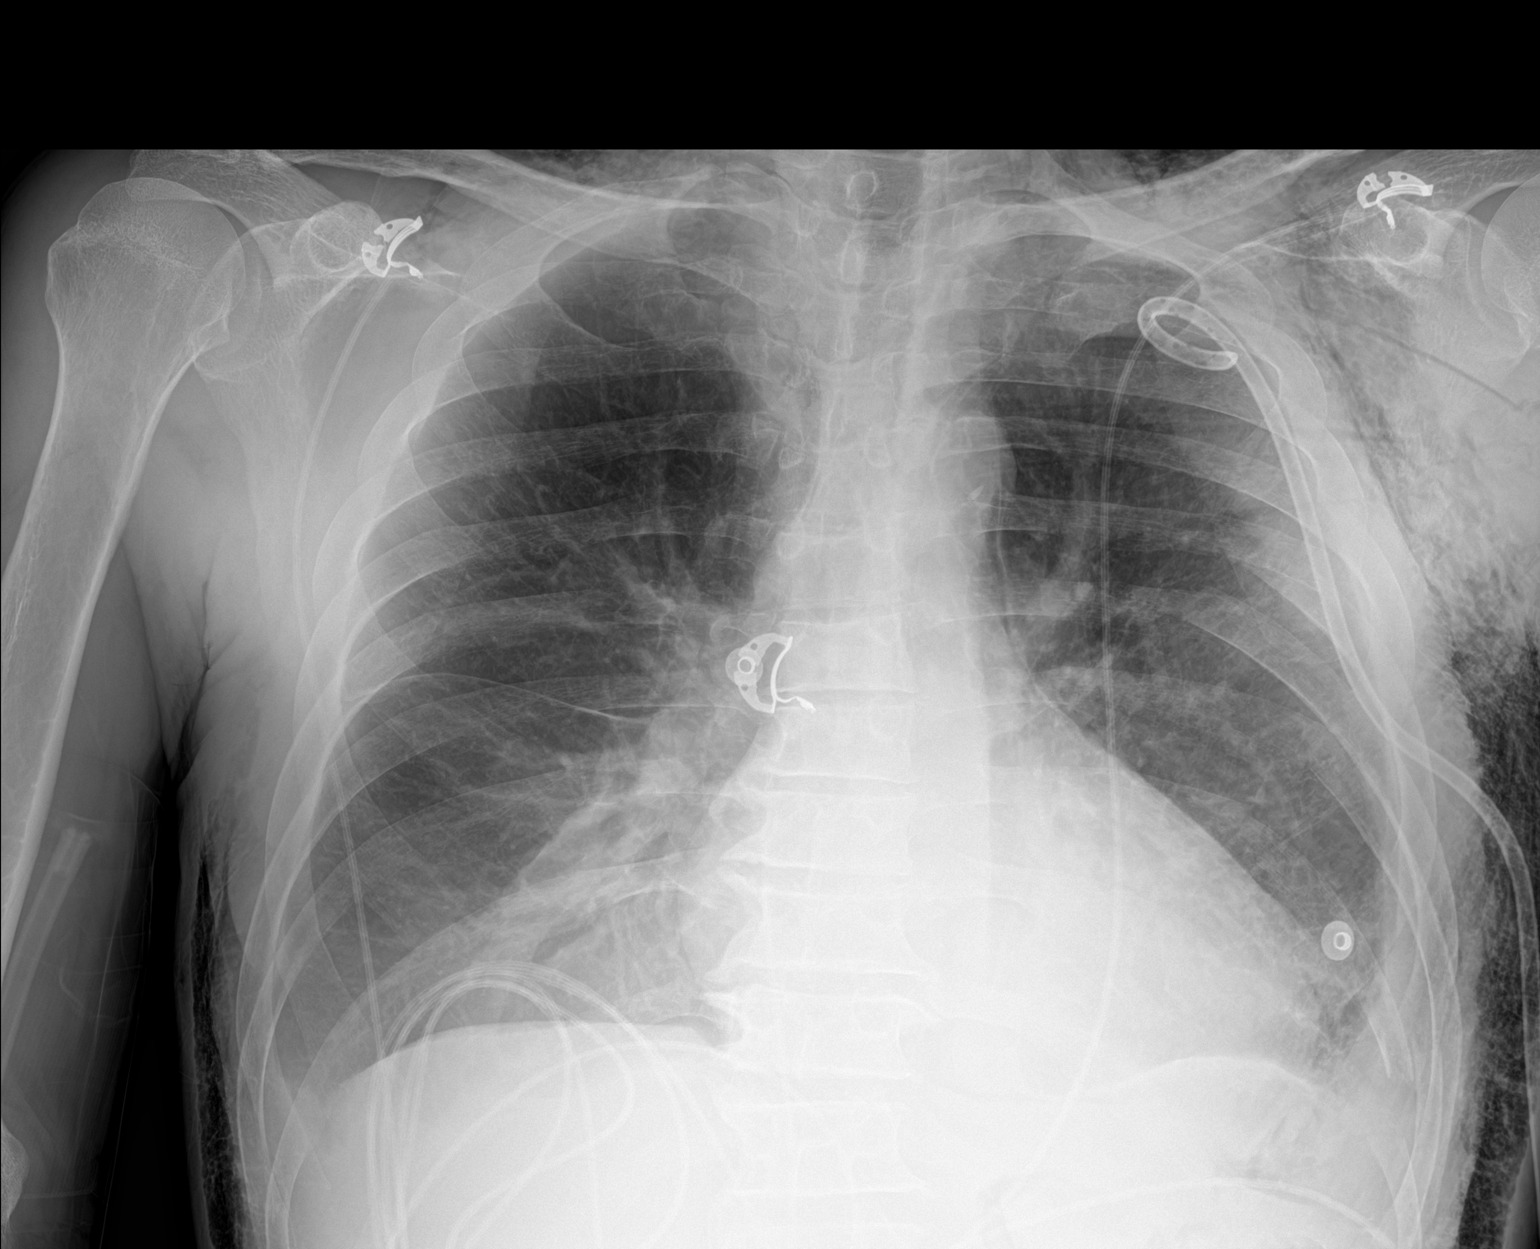

[1 of 1 positions shown; findings below may reference images not displayed]

FINDINGS: Left chest tube in place, unchanged. No appreciable pneumothorax.
Small bilateral effusions. Slight atelectasis at the left lung base,
unchanged. Multiple displaced left rib fractures. Subcutaneous
emphysema has diminished.

Heart size and vascularity are normal. Pneumomediastinum is slightly
less prominent.
IMPRESSION: 1. No pneumothorax.
2. Slight decrease in pneumomediastinum.
3. Persistent small bilateral effusions and left base atelectasis.
